# Patient Record
Sex: Male | Born: 1955 | Race: Black or African American | Hispanic: No | Marital: Married | Smoking: Never smoker
Health system: Southern US, Community
[De-identification: ages and names within clinical notes are randomized; demographics above are authoritative.]

## PROBLEM LIST (undated history)

## (undated) DIAGNOSIS — I10 Essential (primary) hypertension: Secondary | ICD-10-CM

## (undated) DIAGNOSIS — N179 Acute kidney failure, unspecified: Secondary | ICD-10-CM

## (undated) DIAGNOSIS — K219 Gastro-esophageal reflux disease without esophagitis: Secondary | ICD-10-CM

## (undated) DIAGNOSIS — Z5189 Encounter for other specified aftercare: Secondary | ICD-10-CM

## (undated) DIAGNOSIS — N183 Chronic kidney disease, stage 3 unspecified: Secondary | ICD-10-CM

## (undated) DIAGNOSIS — K0530 Chronic periodontitis, unspecified: Secondary | ICD-10-CM

## (undated) DIAGNOSIS — K7460 Unspecified cirrhosis of liver: Secondary | ICD-10-CM

## (undated) DIAGNOSIS — K7290 Hepatic failure, unspecified without coma: Secondary | ICD-10-CM

## (undated) DIAGNOSIS — Z944 Liver transplant status: Secondary | ICD-10-CM

## (undated) HISTORY — DX: Other disorders of phosphorus metabolism: E83.39

## (undated) HISTORY — DX: Acute kidney failure, unspecified: N17.9

## (undated) HISTORY — DX: Unspecified cirrhosis of liver: K74.60

## (undated) HISTORY — PX: ABDOMINAL SURGERY: SHX537

## (undated) HISTORY — DX: Liver transplant status: Z94.4

## (undated) HISTORY — DX: Chronic periodontitis, unspecified: K05.30

## (undated) HISTORY — DX: Chronic kidney disease, stage 3 (moderate): N18.3

## (undated) HISTORY — DX: Gastro-esophageal reflux disease without esophagitis: K21.9

## (undated) HISTORY — DX: Hypomagnesemia: E83.42

## (undated) HISTORY — DX: Chronic kidney disease, stage 3 unspecified: N18.30

## (undated) HISTORY — DX: Encounter for other specified aftercare: Z51.89

## (undated) HISTORY — DX: Hepatic failure, unspecified without coma: K72.90

## (undated) HISTORY — PX: COLONOSCOPY: SHX174

---

## 2008-03-13 ENCOUNTER — Encounter: Admission: RE | Admit: 2008-03-13 | Discharge: 2008-03-13 | Payer: Self-pay | Admitting: Chiropractic Medicine

## 2013-04-04 ENCOUNTER — Other Ambulatory Visit: Payer: Self-pay | Admitting: Family Medicine

## 2013-04-04 ENCOUNTER — Ambulatory Visit
Admission: RE | Admit: 2013-04-04 | Discharge: 2013-04-04 | Disposition: A | Discharge status: Home / Self Care | Payer: 59 | Source: Ambulatory Visit | Attending: Family Medicine | Admitting: Family Medicine

## 2013-04-04 DIAGNOSIS — M7989 Other specified soft tissue disorders: Secondary | ICD-10-CM

## 2014-10-28 ENCOUNTER — Encounter (HOSPITAL_COMMUNITY): Payer: Self-pay | Admitting: Emergency Medicine

## 2014-10-28 ENCOUNTER — Inpatient Hospital Stay (HOSPITAL_COMMUNITY)
Admission: EM | Admit: 2014-10-28 | Discharge: 2014-11-03 | DRG: 441 | Disposition: A | Discharge status: Other Acute Inpatient Hospital | Payer: 59 | Attending: Internal Medicine | Admitting: Internal Medicine

## 2014-10-28 ENCOUNTER — Emergency Department (HOSPITAL_COMMUNITY): Payer: 59

## 2014-10-28 DIAGNOSIS — R197 Diarrhea, unspecified: Secondary | ICD-10-CM | POA: Diagnosis present

## 2014-10-28 DIAGNOSIS — D696 Thrombocytopenia, unspecified: Secondary | ICD-10-CM | POA: Diagnosis present

## 2014-10-28 DIAGNOSIS — Z6826 Body mass index (BMI) 26.0-26.9, adult: Secondary | ICD-10-CM

## 2014-10-28 DIAGNOSIS — K7200 Acute and subacute hepatic failure without coma: Secondary | ICD-10-CM | POA: Diagnosis present

## 2014-10-28 DIAGNOSIS — D689 Coagulation defect, unspecified: Secondary | ICD-10-CM | POA: Diagnosis present

## 2014-10-28 DIAGNOSIS — K7460 Unspecified cirrhosis of liver: Secondary | ICD-10-CM | POA: Diagnosis present

## 2014-10-28 DIAGNOSIS — R7989 Other specified abnormal findings of blood chemistry: Secondary | ICD-10-CM

## 2014-10-28 DIAGNOSIS — E876 Hypokalemia: Secondary | ICD-10-CM | POA: Diagnosis present

## 2014-10-28 DIAGNOSIS — K7290 Hepatic failure, unspecified without coma: Secondary | ICD-10-CM | POA: Diagnosis present

## 2014-10-28 DIAGNOSIS — E43 Unspecified severe protein-calorie malnutrition: Secondary | ICD-10-CM | POA: Diagnosis present

## 2014-10-28 DIAGNOSIS — Z79899 Other long term (current) drug therapy: Secondary | ICD-10-CM | POA: Diagnosis not present

## 2014-10-28 DIAGNOSIS — I10 Essential (primary) hypertension: Secondary | ICD-10-CM | POA: Diagnosis present

## 2014-10-28 DIAGNOSIS — R945 Abnormal results of liver function studies: Secondary | ICD-10-CM

## 2014-10-28 HISTORY — DX: Essential (primary) hypertension: I10

## 2014-10-28 LAB — PROTIME-INR
INR: 2.63 — ABNORMAL HIGH (ref 0.00–1.49)
PROTHROMBIN TIME: 27.7 s — AB (ref 11.6–15.2)

## 2014-10-28 LAB — COMPREHENSIVE METABOLIC PANEL
ALT: 1339 U/L — ABNORMAL HIGH (ref 17–63)
ANION GAP: 9 (ref 5–15)
AST: 1519 U/L — ABNORMAL HIGH (ref 15–41)
Albumin: 2.7 g/dL — ABNORMAL LOW (ref 3.5–5.0)
Alkaline Phosphatase: 154 U/L — ABNORMAL HIGH (ref 38–126)
BILIRUBIN TOTAL: 11 mg/dL — AB (ref 0.3–1.2)
BUN: 7 mg/dL (ref 6–20)
CO2: 28 mmol/L (ref 22–32)
Calcium: 8.8 mg/dL — ABNORMAL LOW (ref 8.9–10.3)
Chloride: 105 mmol/L (ref 101–111)
Creatinine, Ser: 0.49 mg/dL — ABNORMAL LOW (ref 0.61–1.24)
Glucose, Bld: 118 mg/dL — ABNORMAL HIGH (ref 65–99)
POTASSIUM: 2.6 mmol/L — AB (ref 3.5–5.1)
Sodium: 142 mmol/L (ref 135–145)
TOTAL PROTEIN: 6.2 g/dL — AB (ref 6.5–8.1)

## 2014-10-28 LAB — URINALYSIS, ROUTINE W REFLEX MICROSCOPIC
Glucose, UA: NEGATIVE mg/dL
Hgb urine dipstick: NEGATIVE
Ketones, ur: NEGATIVE mg/dL
Leukocytes, UA: NEGATIVE
NITRITE: NEGATIVE
PROTEIN: NEGATIVE mg/dL
SPECIFIC GRAVITY, URINE: 1.014 (ref 1.005–1.030)
UROBILINOGEN UA: 1 mg/dL (ref 0.0–1.0)
pH: 6.5 (ref 5.0–8.0)

## 2014-10-28 LAB — CBC
HEMATOCRIT: 39.5 % (ref 39.0–52.0)
HEMOGLOBIN: 13.5 g/dL (ref 13.0–17.0)
MCH: 30.9 pg (ref 26.0–34.0)
MCHC: 34.2 g/dL (ref 30.0–36.0)
MCV: 90.4 fL (ref 78.0–100.0)
Platelets: 83 10*3/uL — ABNORMAL LOW (ref 150–400)
RBC: 4.37 MIL/uL (ref 4.22–5.81)
RDW: 15.5 % (ref 11.5–15.5)
WBC: 6.2 10*3/uL (ref 4.0–10.5)

## 2014-10-28 LAB — LIPASE, BLOOD: Lipase: 47 U/L (ref 22–51)

## 2014-10-28 LAB — MAGNESIUM: MAGNESIUM: 1.4 mg/dL — AB (ref 1.7–2.4)

## 2014-10-28 LAB — BILIRUBIN, DIRECT: Bilirubin, Direct: 4.5 mg/dL — ABNORMAL HIGH (ref 0.1–0.5)

## 2014-10-28 LAB — ACETAMINOPHEN LEVEL

## 2014-10-28 MED ORDER — POTASSIUM CHLORIDE CRYS ER 20 MEQ PO TBCR
40.0000 meq | EXTENDED_RELEASE_TABLET | Freq: Once | ORAL | Status: AC
  Administered 2014-10-28: 40 meq via ORAL
  Filled 2014-10-28: qty 2

## 2014-10-28 NOTE — ED Notes (Signed)
Pt states that he has had weakness and diarrhea for about a month. Pt states that he saw his PCP and was told has virus. Pt unsure if virus is gone or not. Pt c/o abd pain, vomiting that started today.

## 2014-10-28 NOTE — ED Notes (Signed)
Patient denies abdominal pain.  Patient c/o nausea and vomiting as well as diarrhea for the past month.  States this only subsides when patient eats rice, bananas, mashed potatoes (bland foods).  States that when he eats anything else it makes him sick.

## 2014-10-28 NOTE — ED Provider Notes (Signed)
CSN: [id number]     Arrival date & time 10/28/14  1727 History   First MD Initiated Contact with Patient 10/28/14 1911     Chief Complaint  Patient presents with  . Fatigue  . Abdominal Pain  . Vomiting  . Diarrhea     (Consider location/radiation/quality/duration/timing/severity/associated sxs/prior Treatment) HPI Comments: Patient presents to the ED with a chief complaint of abdominal pain, weakness, vomiting, and diarrhea.  Patient seen by his PCP and reportedly diagnosed with gastroenteritis.  Patient states that his symptoms have been persistent for the past month.  He states that the abdominal pain and vomiting are worsened after eating.  He states that sometimes it feel like he has GERD.  He has not tried taking for his symptoms.  He denies any other medical problems.  Denies any alcohol use.  The history is provided by the patient. No language interpreter was used.    Past Medical History  Diagnosis Date  . Hypertension    Past Surgical History  Procedure Laterality Date  . Abdominal surgery     No family history on file. Social History  Substance Use Topics  . Smoking status: Never Smoker   . Smokeless tobacco: None  . Alcohol Use: No    Review of Systems  Constitutional: Positive for fatigue. Negative for fever and chills.  Respiratory: Negative for shortness of breath.   Cardiovascular: Negative for chest pain.  Gastrointestinal: Positive for nausea, vomiting, abdominal pain and diarrhea. Negative for constipation.  Genitourinary: Negative for dysuria.  All other systems reviewed and are negative.     Allergies  Review of patient's allergies indicates no known allergies.  Home Medications   Prior to Admission medications   Medication Sig Start Date End Date Taking? Authorizing Provider  AZOR 10-40 MG tablet Take 1 tablet by mouth daily.  10/09/14  Yes Historical Provider, MD  doxazosin (CARDURA) 8 MG tablet Take 8 mg by mouth daily.  10/09/14  Yes  Historical Provider, MD   BP 143/91 mmHg  Pulse 94  Temp(Src) 97.7 F (36.5 C) (Oral)  Resp 18  SpO2 100% Physical Exam  Constitutional: He is oriented to person, place, and time. He appears well-developed and well-nourished.  HENT:  Head: Normocephalic and atraumatic.  Eyes: Conjunctivae and EOM are normal. Pupils are equal, round, and reactive to light. Right eye exhibits no discharge. Left eye exhibits no discharge. No scleral icterus.  Neck: Normal range of motion. Neck supple. No JVD present.  Cardiovascular: Normal rate, regular rhythm and normal heart sounds.  Exam reveals no gallop and no friction rub.   No murmur heard. Pulmonary/Chest: Effort normal and breath sounds normal. No respiratory distress. He has no wheezes. He has no rales. He exhibits no tenderness.  Abdominal: Soft. He exhibits no distension and no mass. There is no tenderness. There is no rebound and no guarding.  No focal abdominal tenderness, no RLQ tenderness or pain at McBurney's point, no RUQ tenderness or Murphy's sign, no left-sided abdominal tenderness, no fluid wave, or signs of peritonitis   Musculoskeletal: Normal range of motion. He exhibits no edema or tenderness.  Neurological: He is alert and oriented to person, place, and time.  Skin: Skin is warm and dry.  Psychiatric: He has a normal mood and affect. His behavior is normal. Judgment and thought content normal.  Nursing note and vitals reviewed.   ED Course  Procedures (including critical care time) Results for orders placed or performed during the hospital encounter of 10/28/14  Lipase, blood  Result Value Ref Range   Lipase 47 22 - 51 U/L  Comprehensive metabolic panel  Result Value Ref Range   Sodium 142 135 - 145 mmol/L   Potassium 2.6 (LL) 3.5 - 5.1 mmol/L   Chloride 105 101 - 111 mmol/L   CO2 28 22 - 32 mmol/L   Glucose, Bld 118 (H) 65 - 99 mg/dL   BUN 7 6 - 20 mg/dL   Creatinine, Ser 0.49 (L) 0.61 - 1.24 mg/dL   Calcium 8.8 (L)  8.9 - 10.3 mg/dL   Total Protein 6.2 (L) 6.5 - 8.1 g/dL   Albumin 2.7 (L) 3.5 - 5.0 g/dL   AST 1519 (H) 15 - 41 U/L   ALT 1339 (H) 17 - 63 U/L   Alkaline Phosphatase 154 (H) 38 - 126 U/L   Total Bilirubin 11.0 (H) 0.3 - 1.2 mg/dL   GFR calc non Af Amer >60 >60 mL/min   GFR calc Af Amer >60 >60 mL/min   Anion gap 9 5 - 15  CBC  Result Value Ref Range   WBC 6.2 4.0 - 10.5 K/uL   RBC 4.37 4.22 - 5.81 MIL/uL   Hemoglobin 13.5 13.0 - 17.0 g/dL   HCT 39.5 39.0 - 52.0 %   MCV 90.4 78.0 - 100.0 fL   MCH 30.9 26.0 - 34.0 pg   MCHC 34.2 30.0 - 36.0 g/dL   RDW 15.5 11.5 - 15.5 %   Platelets 83 (L) 150 - 400 K/uL  Urinalysis, Routine w reflex microscopic (not at [facility])  Result Value Ref Range   Color, Urine ORANGE (A) YELLOW   APPearance CLEAR CLEAR   Specific Gravity, Urine 1.014 1.005 - 1.030   pH 6.5 5.0 - 8.0   Glucose, UA NEGATIVE NEGATIVE mg/dL   Hgb urine dipstick NEGATIVE NEGATIVE   Bilirubin Urine MODERATE (A) NEGATIVE   Ketones, ur NEGATIVE NEGATIVE mg/dL   Protein, ur NEGATIVE NEGATIVE mg/dL   Urobilinogen, UA 1.0 0.0 - 1.0 mg/dL   Nitrite NEGATIVE NEGATIVE   Leukocytes, UA NEGATIVE NEGATIVE   US Abdomen Complete  10/28/2014   CLINICAL DATA:  Abdominal pain, nausea vomiting started 1 day ago. Increased liver function tests.  EXAM: ULTRASOUND ABDOMEN COMPLETE  COMPARISON:  None.  FINDINGS: Gallbladder: The gallbladder is normally distended and contains a large amount of sludge. Gallbladder wall measures 3 mm.  Common bile duct: Diameter: 2 mm.  Liver: The liver demonstrates diffusely heterogeneous echogenicity. Normal hepatopetal portal venous flow. Sonographic Percell Miller sign is negative.  IVC: No abnormality visualized.  Pancreas: Is not seen.  Spleen: Size and appearance within normal limits.  Right Kidney: Length: 11.7 cm. Echogenicity within normal limits. No mass or hydronephrosis visualized.  Left Kidney: Length: 8.8 cm. 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 Echogenicity within normal  limits. No mass or hydronephrosis visualized.  Abdominal aorta: No evidence of aneurysmal dilation. Maximum diameter of the visualized proximal aorta is 2.6 cm.  Other findings: Minimal perihepatic abdominal ascites.  IMPRESSION: Diffusely heterogeneous echogenicity of the liver which may be seen with hepatic cirrhosis or fibrosis. Minimal perihepatic ascites.  Gallbladder sludge without evidence of acute cholecystitis.   Electronically Signed   By: Fidela Salisbury M.D.   On: 10/28/2014 20:46      MDM   Final diagnoses:  Elevated LFTs    Reports abdominal pain, nvd, but no abdominal pain on exam.  LFTs are through the roof.  Will check hepatitis panel and US abdomen.  Denies any  etoh use.  VSS. Denies excessive use of acetaminophen. K is low, will supplement orally.  Suspect a lot of this is from chronic diarrhea.  Patient discussed with Dr. Billy Fischer, who recommends GI consult.    Appreciate phone consultation with Dr. Collene Mares from GI, who recommends admission for elevated transaminases. Patient will need HIDA and hepatitis panel.  GI will consult in the morning.  I discussed this plan with the patient and his family, all are in agreement.  Appreciate Dr. Hal Hope for admitting the patient.  Mg, acetaminophen, direct bilirubin, and PT INR pending.  Montine Circle, PA-C 10/28/14 2350  Gareth Morgan, MD 10/31/14 0025

## 2014-10-29 ENCOUNTER — Inpatient Hospital Stay (HOSPITAL_COMMUNITY): Payer: 59

## 2014-10-29 ENCOUNTER — Encounter (HOSPITAL_COMMUNITY): Payer: Self-pay

## 2014-10-29 DIAGNOSIS — I10 Essential (primary) hypertension: Secondary | ICD-10-CM

## 2014-10-29 DIAGNOSIS — D696 Thrombocytopenia, unspecified: Secondary | ICD-10-CM | POA: Diagnosis present

## 2014-10-29 DIAGNOSIS — R197 Diarrhea, unspecified: Secondary | ICD-10-CM | POA: Diagnosis present

## 2014-10-29 DIAGNOSIS — E876 Hypokalemia: Secondary | ICD-10-CM | POA: Diagnosis present

## 2014-10-29 LAB — HEPATIC FUNCTION PANEL
ALBUMIN: 2.5 g/dL — AB (ref 3.5–5.0)
ALK PHOS: 147 U/L — AB (ref 38–126)
ALT: 1236 U/L — AB (ref 17–63)
AST: 1393 U/L — ABNORMAL HIGH (ref 15–41)
BILIRUBIN INDIRECT: 5.8 mg/dL — AB (ref 0.3–0.9)
BILIRUBIN TOTAL: 10.7 mg/dL — AB (ref 0.3–1.2)
Bilirubin, Direct: 4.9 mg/dL — ABNORMAL HIGH (ref 0.1–0.5)
TOTAL PROTEIN: 5.9 g/dL — AB (ref 6.5–8.1)

## 2014-10-29 LAB — BASIC METABOLIC PANEL
ANION GAP: 8 (ref 5–15)
BUN: 7 mg/dL (ref 6–20)
CHLORIDE: 108 mmol/L (ref 101–111)
CO2: 28 mmol/L (ref 22–32)
Calcium: 8.6 mg/dL — ABNORMAL LOW (ref 8.9–10.3)
Creatinine, Ser: 0.45 mg/dL — ABNORMAL LOW (ref 0.61–1.24)
GFR calc non Af Amer: >60 mL/min (ref >60)
GLUCOSE: 88 mg/dL (ref 65–99)
Potassium: 3.6 mmol/L (ref 3.5–5.1)
Sodium: 144 mmol/L (ref 135–145)

## 2014-10-29 LAB — PROTIME-INR
INR: 2.56 — ABNORMAL HIGH (ref 0.00–1.49)
Prothrombin Time: 27.1 seconds — ABNORMAL HIGH (ref 11.6–15.2)

## 2014-10-29 LAB — CBC
HCT: 36.3 % — ABNORMAL LOW (ref 39.0–52.0)
HEMOGLOBIN: 12.3 g/dL — AB (ref 13.0–17.0)
MCH: 30.1 pg (ref 26.0–34.0)
MCHC: 33.9 g/dL (ref 30.0–36.0)
MCV: 88.8 fL (ref 78.0–100.0)
Platelets: 86 10*3/uL — ABNORMAL LOW (ref 150–400)
RBC: 4.09 MIL/uL — AB (ref 4.22–5.81)
RDW: 15.7 % — ABNORMAL HIGH (ref 11.5–15.5)
WBC: 5.6 10*3/uL (ref 4.0–10.5)

## 2014-10-29 LAB — HIV ANTIBODY (ROUTINE TESTING W REFLEX): HIV Screen 4th Generation wRfx: NONREACTIVE

## 2014-10-29 LAB — AMMONIA: Ammonia: 93 umol/L — ABNORMAL HIGH (ref 9–35)

## 2014-10-29 MED ORDER — BOOST / RESOURCE BREEZE PO LIQD
1.0000 | Freq: Three times a day (TID) | ORAL | Status: DC
  Administered 2014-10-29 – 2014-11-01 (×8): 1 via ORAL

## 2014-10-29 MED ORDER — DOXAZOSIN MESYLATE 8 MG PO TABS
8.0000 mg | ORAL_TABLET | Freq: Every day | ORAL | Status: DC
  Administered 2014-10-29 – 2014-11-02 (×5): 8 mg via ORAL
  Filled 2014-10-29 (×6): qty 1

## 2014-10-29 MED ORDER — ONDANSETRON HCL 4 MG/2ML IJ SOLN: 4.0000 mg | Freq: Four times a day (QID) | INTRAMUSCULAR | Status: DC | PRN

## 2014-10-29 MED ORDER — POTASSIUM CHLORIDE IN NACL 20-0.9 MEQ/L-% IV SOLN
INTRAVENOUS | Status: DC
  Administered 2014-10-29: 03:00:00 via INTRAVENOUS
  Filled 2014-10-29 (×2): qty 1000

## 2014-10-29 MED ORDER — POTASSIUM CHLORIDE 10 MEQ/100ML IV SOLN
10.0000 meq | INTRAVENOUS | Status: AC
  Administered 2014-10-29 (×3): 10 meq via INTRAVENOUS
  Filled 2014-10-29 (×3): qty 100

## 2014-10-29 MED ORDER — POTASSIUM CHLORIDE CRYS ER 20 MEQ PO TBCR
40.0000 meq | EXTENDED_RELEASE_TABLET | Freq: Once | ORAL | Status: AC
  Administered 2014-10-29: 40 meq via ORAL
  Filled 2014-10-29: qty 2

## 2014-10-29 MED ORDER — MAGNESIUM SULFATE 2 GM/50ML IV SOLN
2.0000 g | Freq: Once | INTRAVENOUS | Status: AC
  Administered 2014-10-29: 2 g via INTRAVENOUS
  Filled 2014-10-29: qty 50

## 2014-10-29 MED ORDER — IRBESARTAN 300 MG PO TABS
300.0000 mg | ORAL_TABLET | Freq: Every day | ORAL | Status: DC
  Filled 2014-10-29: qty 1

## 2014-10-29 MED ORDER — AMLODIPINE BESYLATE 10 MG PO TABS
10.0000 mg | ORAL_TABLET | Freq: Every day | ORAL | Status: DC
  Administered 2014-10-29 – 2014-11-02 (×5): 10 mg via ORAL
  Filled 2014-10-29 (×6): qty 1

## 2014-10-29 MED ORDER — DEXTROSE 5 % IV SOLN
5.0000 mg | Freq: Once | INTRAVENOUS | Status: AC
  Administered 2014-10-29: 5 mg via INTRAVENOUS
  Filled 2014-10-29: qty 0.5

## 2014-10-29 MED ORDER — CETYLPYRIDINIUM CHLORIDE 0.05 % MT LIQD
7.0000 mL | Freq: Two times a day (BID) | OROMUCOSAL | Status: DC
  Administered 2014-10-29 – 2014-11-02 (×10): 7 mL via OROMUCOSAL

## 2014-10-29 MED ORDER — ONDANSETRON HCL 4 MG PO TABS: 4.0000 mg | ORAL_TABLET | Freq: Four times a day (QID) | ORAL | Status: DC | PRN

## 2014-10-29 MED ORDER — AMLODIPINE-OLMESARTAN 10-40 MG PO TABS: 1.0000 | ORAL_TABLET | Freq: Every day | ORAL | Status: DC

## 2014-10-29 MED ORDER — ENSURE ENLIVE PO LIQD: 237.0000 mL | Freq: Two times a day (BID) | ORAL | Status: DC

## 2014-10-29 MED ORDER — IOHEXOL 300 MG/ML  SOLN
100.0000 mL | Freq: Once | INTRAMUSCULAR | Status: AC | PRN
  Administered 2014-10-29: 100 mL via INTRAVENOUS

## 2014-10-29 MED ORDER — IOHEXOL 300 MG/ML  SOLN
25.0000 mL | INTRAMUSCULAR | Status: AC
  Administered 2014-10-29 (×2): 25 mL via ORAL

## 2014-10-29 NOTE — Consult Note (Signed)
Referring Provider:   Dr. Daleen Bo Primary Care Physician:  Dr. Samara Snide Primary Gastroenterologist:  Dr. Clarene Essex  Reason for Consultation:  Elevated liver chemistries  HPI: Bruce Conner is a 59 y.o. male with no prior history of liver disease and no significant history of alcohol abuse, who has a roughly 1 month history of intermittent postprandial diarrhea, 30 pound weight loss, two-week history of dark urine, intermittent fatigue who was admitted through the emergency room yesterday when his wife noticed that his eyes were yellow. Risk factors for hepatitis appeared to be absent. 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 With respect to blood pouring pathogens, no history of IV drug abuse or tattoos. No known toxic exposures such as excessive acetaminophen or herbal supplements.  Radiographic evaluation shows an inhomogenous liver parenchyma consistent with possible fatty infiltration, no nodularity, significant ascites, splenomegaly, or varices to suggest cirrhosis. There is sludge in the gallbladder but no evidence of biliary obstruction.  Labs are pertinent for transaminases in the 1000 range, bilirubin around 10,  minimally improved today compared to yesterday. INR is moderately elevated at 2.5, and platelets are low at 86,000.  The patient has not had abdominal pain and has had reasonable stamina, going to work as recently as 3 days ago.   Past Medical History  Diagnosis Date  . Hypertension     Past Surgical History  Procedure Laterality Date  . Abdominal surgery      Prior to Admission medications   Medication Sig Start Date End Date Taking? Authorizing Provider  AZOR 10-40 MG tablet Take 1 tablet by mouth daily.  10/09/14  Yes Historical Provider, MD  doxazosin (CARDURA) 8 MG  tablet Take 8 mg by mouth daily.  10/09/14  Yes Historical Provider, MD    Current Facility-Administered Medications  Medication Dose Route Frequency Provider Last Rate Last Dose  . 0.9 % NaCl with KCl 20 mEq/ L  infusion   Intravenous Continuous Rise Patience, MD 75 mL/hr at 10/29/14 0252    . amLODipine (NORVASC) tablet 10 mg  10 mg Oral Daily Rise Patience, MD   10 mg at 10/29/14 0940  . antiseptic oral rinse (CPC / CETYLPYRIDINIUM CHLORIDE 0.05%) solution 7 mL  7 mL Mouth Rinse BID Rise Patience, MD   7 mL at 10/29/14 1000  . doxazosin (CARDURA) tablet 8 mg  8 mg Oral Daily Rise Patience, MD   8 mg at 10/29/14 0940  . feeding supplement (BOOST / RESOURCE BREEZE) liquid 1 Container  1 Container Oral TID BM Rosezetta Schlatter, RD   1 Container at 10/29/14 1400  . ondansetron (ZOFRAN) tablet 4 mg  4 mg Oral Q6H PRN Rise Patience, MD       Or  . ondansetron [facility]) injection 4 mg  4 mg Intravenous Q6H PRN Rise Patience, MD        Allergies as of 10/28/2014  . (No Known Allergies)    Family History  Problem Relation Age of Onset  . Hypertension Brother     Social History   Social History  . Marital Status: Unknown    Spouse Name: N/A  . Number of Children: N/A  . Years of Education: N/A   Occupational History  . Not on file.  Social History Main Topics  . Smoking status: Never Smoker   . Smokeless tobacco: Not on file  . Alcohol Use: No  . Drug Use: Not on file  . Sexual Activity: Not on file   Other Topics Concern  . Not on file   Social History Narrative    Review of Systems: No problem with chest pain or breathing difficulties, headaches or loose teeth, skin rashes or joint effusions, urinary difficulties or confusional episodes. No problem with anorexia, despite his weight loss which he attributes to malabsorption (postprandial diarrhea).  Physical Exam: Vital signs in last 24 hours: Temp:  [98.2 F (36.8 C)-98.5 F (36.9  C)] 98.2 F (36.8 C) (10/10 1500) Pulse Rate:  [65-88] 80 (10/10 1500) Resp:  [18-20] 18 (10/10 1500) BP: (132-158)/(68-76) 145/72 mmHg (10/10 1500) SpO2:  [97 %-99 %] 98 % (10/10 1500) Weight:  [71.668 kg (158 lb)] 71.668 kg (158 lb) (10/10 0012) Last BM Date: 10/28/14 General:   Alert,  Well-developed, well-nourished, pleasant and cooperative in NAD Head:  Normocephalic and atraumatic. Eyes:  Sclerae icteric.   Conjunctiva pink. Mouth:   No ulcerations or lesions.  Oropharynx pink & moist. Poor dentition. Neck:   No masses or thyromegaly. Lungs:  Clear throughout to auscultation.   No wheezes, crackles, or rhonchi. No evident respiratory distress. Heart:   Regular rate and rhythm; no murmurs, clicks, rubs,  or gallops. Abdomen:  Soft, nontender, nontympanitic, and nondistended. No masses, hepatosplenomegaly or ventral hernias noted. No obvious ascites. Normal bowel sounds, without bruits, guarding, or rebound.   Msk:   Symmetrical without gross deformities. Extremities:   Without clubbing, cyanosis, or edema. Neurologic:  Alert and coherent;  grossly normal neurologically. He does serial threes slowly and with one error. There is perhaps 1 beat of asterixis. Skin:  Intact without significant lesions or rashes. Cervical Nodes:  No significant cervical adenopathy. Psych:   Alert and cooperative. Normal mood and affect.  Intake/Output from previous day: 10/09 0701 - 10/10 0700 In: 610 [I.V.:310; IV Piggyback:300] Out: -  Intake/Output this shift:    Lab Results:  Recent Labs  10/28/14 1811 10/29/14 0443  WBC 6.2 5.6  HGB 13.5 12.3*  HCT 39.5 36.3*  PLT 83* 86*   BMET  Recent Labs  10/28/14 1811 10/29/14 0443  NA 142 144  K 2.6* 3.6  CL 105 108  CO2 28 28  GLUCOSE 118* 88  BUN 7 7  CREATININE 0.49* 0.45*  CALCIUM 8.8* 8.6*   LFT  Recent Labs  10/29/14 0443  PROT 5.9*  ALBUMIN 2.5*  AST 1393*  ALT 1236*  ALKPHOS 147*  BILITOT 10.7*  BILIDIR 4.9*   IBILI 5.8*   PT/INR  Recent Labs  10/28/14 2256 10/29/14 0443  LABPROT 27.7* 27.1*  INR 2.63* 2.56*    Studies/Results: US Abdomen Complete  10/28/2014   CLINICAL DATA:  Abdominal pain, nausea vomiting started 1 day ago. Increased liver function tests.  EXAM: ULTRASOUND ABDOMEN COMPLETE  COMPARISON:  None.  FINDINGS: Gallbladder: The gallbladder is normally distended and contains a large amount of sludge. Gallbladder wall measures 3 mm.  Common bile duct: Diameter: 2 mm.  Liver: The liver demonstrates diffusely heterogeneous echogenicity. Normal hepatopetal portal venous flow. Sonographic Percell Miller sign is negative.  IVC: No abnormality visualized.  Pancreas: Is not seen.  Spleen: Size and appearance within normal limits.  Right Kidney: Length: 11.7 cm. Echogenicity within normal limits. No mass or hydronephrosis visualized.  Left Kidney: Length: 8.8 cm. 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 Echogenicity within normal limits. No mass or hydronephrosis visualized.  Abdominal aorta: No evidence of aneurysmal dilation. Maximum diameter of the visualized proximal aorta is 2.6 cm.  Other findings: Minimal perihepatic abdominal ascites.  IMPRESSION: Diffusely heterogeneous echogenicity of the liver which may be seen with hepatic cirrhosis or fibrosis. Minimal perihepatic ascites.  Gallbladder sludge without evidence of acute cholecystitis.   Electronically Signed   By: Fidela Salisbury M.D.   On: 10/28/2014 20:46   Ct Abdomen Pelvis W Contrast  10/29/2014   CLINICAL DATA:  Persistent diarrhea for the last month. Weight loss. Elevated LFTs.  EXAM: CT ABDOMEN AND PELVIS WITH CONTRAST  TECHNIQUE: Multidetector CT imaging of the abdomen and pelvis was performed using the standard protocol following bolus administration of intravenous contrast.  CONTRAST:  181mL OMNIPAQUE IOHEXOL 300 MG/ML  SOLN  COMPARISON:  Ultrasound 10/28/2014  FINDINGS: Minimal bibasilar atelectasis, left greater than right. No effusions.  Heart is normal size.  Heterogeneous low density throughout the liver, likely areas a geographic fatty infiltration. No definite focal abnormality. No biliary ductal dilatation. Gallbladder, spleen, pancreas, adrenals and right kidney are unremarkable. Left kidney is a in the low left pelvis posterior to the left bladder wall. No hydronephrosis or focal renal abnormality.  Stomach, large and small bowel are unremarkable. There is a small amount of free fluid around the liver. No free air or adenopathy. Urinary bladder is unremarkable. Aorta is mildly tortuous, non aneurysmal.  No acute bony abnormality or focal bone lesion.  IMPRESSION: Heterogeneous areas of low density throughout the liver, likely geographic fatty infiltration. Small amount of free fluid around the liver.  Bibasilar atelectasis, left greater than right.  Left pelvic kidney.   Electronically Signed   By: Rolm Baptise M.D.   On: 10/29/2014 10:52    Impression: 1. Acute hepatitis without current evidence of hepatic failure (mentation okay, no progressive rise in bilirubin or INR). I suspect that this is some sort of infectious process, but it remains unclear whether we will be able to confirm a culprit serologically, such as hepatitis A or hepatitis B. 2. One month history of postprandial diarrhea and significant weight loss. The relationship of this to the patient's hepatitis is unclear, as is its etiology. 3. I do not see evidence for significant chronic underlying liver disease, such as hepatic nodularity on imaging studies, splenomegaly to suggest portal hypertension, varices, significant ascites, etc. I think that his thrombocytopenia may simply be due to bone marrow suppression from the presumed infectious illness that is causing his hepatitis.  Plan: 1. Await hepatitis serologies 2. Administer vitamin K to see if it will help correct his elevated INR 3. No further diagnostic testing on the liver anticipated at present 4. Monitor  for evidence of fulminant hepatic failure, which would manifest primarily as altered mental status or possibly a dramatic rise in his bilirubin. 5. Despite his elevated ammonia, I do not feel he needs lactulose as long as his mental status remains good, as it currently is. 6. Check stool for qualitative fecal fat to see if he is showing signs of malabsorption. If so, further testing to include celiac antibodies and perhaps a therapeutic trial of pancreatic enzymes would be in order.  Cleotis Nipper, M.D. Pager [phone] If no answer or after 5 PM call [phone]     LOS: 1 day   [place] [facility]  10/29/2014, 8:10 PM   Pager [phone] If no answer or after 5 PM call [phone]

## 2014-10-29 NOTE — Progress Notes (Signed)
Initial Nutrition Assessment  DOCUMENTATION CODES:   Severe malnutrition in context of acute illness/injury  INTERVENTION:  - Will d/c Ensure Enlive and order Boost Breeze TID, each supplement provides 250 kcal and 9 grams of protein - RD will continue to monitor for needs - Continue to encourage Bland diet  NUTRITION DIAGNOSIS:   Inadequate oral intake related to acute illness, other (see comment) (diarrhea) as evidenced by per patient/family report.  GOAL:   Patient will meet greater than or equal to 90% of their needs  MONITOR:   PO intake, Supplement acceptance, Weight trends, Labs, I & O's  REASON FOR ASSESSMENT:   Malnutrition Screening Tool  ASSESSMENT:   59 y.o. male with history of hypertension presents to the ER because of persistent diarrhea over the last 1 month. Patient states his diarrhea is more on eating solid food. Happens 2-3 times a day. Denies any recent use of antibiotics or sick contacts. Denies any recent travel. Denies any fever chills or abdominal pain. Denies any nausea vomiting. Patient states he may have lost at least 30 pounds. In the ER patient was found to have markedly elevated LFTs. Sonogram of abdomen shows gallbladder sludge with possible cirrhosis. On-call gastroenterologist Dr. Collene Mares was consulted and patient has been admitted for further management. Patient denies taking any NSAIDs or Tylenol. Patient states his diarrhea is watery denies any blood in the diarrhea. Lab work shows a markedly elevated LFTs with elevated INR and thrombocytopenia. Patient's last alcohol drink was in 1998 until patient used to drink alcohol once or twice a month.  Pt seen for MST. BMI indicates overweight status. Pt ate ~50% of breakfast which consisted of rice krispies, applesauce, and plain toast. Pt denies abdominal pain or nausea at this time and states that he was not experiencing this PTA. He states he had one episode of emesis yesterday (10/9) but no previous or  subsequent episodes. He states that for the past 1 month he has been having diarrhea. He states that this was exacerbated by heavily spiced or greasy/fatty foods. He has been able to tolerate bland foods (he gives the examples of plain baked potato or mashed potatoes, plain white rice, applesauce, and plain toast) without issue/exacerbation as well as fluids (which he states he was only drinking Gatorade, Sprite, and iced tea PTA).  Pt states that his weight had previously been stable but that he has lost 26 lbs in the past month. He states UBW of 186 lbs. Per chart review, pt has lost 28 lbs (15% body weight) in the past 1 month. Mild muscle and fat wasting noted.  Pt not meeting needs. Will d/c Ensure Enlive and order Boost Breeze as pt may do better with clear liquid supplement at this time. Will also order Prostat at follow-up if pt tolerating Boost Breeze well. Medications reviewed. Labs reviewed; creatinine low, Ca: 8.6 mg/dL, Mg: 1.4 mg/dL, LFTs elevated.    Diet Order:  Diet Heart Room service appropriate?: Yes; Fluid consistency:: Thin  Skin:  Reviewed, no issues  Last BM:  10/9  Height:   Ht Readings from Last 1 Encounters:  10/29/14 5\' 5"  (1.651 m)    Weight:   Wt Readings from Last 1 Encounters:  10/29/14 158 lb (71.668 kg)    Ideal Body Weight:  61.82 kg (kg)  BMI:  Body mass index is 26.29 kg/(m^2).  Estimated Nutritional Needs:   Kcal:  1790-1990  Protein:  70-80 grams  Fluid:  >/= 2.5 L/day  EDUCATION NEEDS:  No education needs identified at this time      Jarome Matin, [place], [facility] Inpatient Clinical Dietitian Pager # [phone] After hours/weekend pager # [phone]

## 2014-10-29 NOTE — Progress Notes (Signed)
[facility] PROGRESS NOTE  ROVERTO BODMER BZJ:[id number] DOB: 04-02-1955 DOA: 10/28/2014 PCP: No primary care provider on file.  Assessment/Plan: 59 y/o male with PMH of HTN presented with diarrhea for 1 month and found to have abnormal LFTs and cirrhosis   1. Liver cirrhosis of unclear etiology. Coagulopathy, thrombocytopenia. Abnormal LFTs of unclear etiology, but likely due to cirrhosis.  -check hepatitis panel, mitochondrial anbd, ANA, pend CT abd/pelvis. consulted GI eval. Hold olmesartan due to GUI side effect    2. Diarrhea in the setting of liver cirrhosis. Pend GI panel   3. HTN. Stable. Cont home regimen   Code Status: full Family Communication: d/w patient (indicate person spoken with, relationship, and if by phone, the number) Disposition Plan: home pend clinical improvement    Consultants:  GI   Procedures:  none  Antibiotics:  none (indicate start date, and stop date if known)  HPI/Subjective: Alert, oriented   Objective: Filed Vitals:   10/29/14 0432  BP: 132/76  Pulse: 88  Temp: 98.5 F (36.9 C)  Resp: 20    Intake/Output Summary (Last 24 hours) at 10/29/14 0908 Last data filed at 10/29/14 0700  Gross per 24 hour  Intake    610 ml  Output      0 ml  Net    610 ml   Filed Weights   10/29/14 0012  Weight: 71.668 kg (158 lb)    Exam:   General:  No distress   Cardiovascular: s1,s2 rrr  Respiratory: CTA BL   Abdomen: soft, nt,nd   Musculoskeletal: no leg edema   Data Reviewed: Basic Metabolic Panel:  Recent Labs Lab 10/28/14 1811 10/28/14 2256 10/29/14 0443  NA 142  --  144  K 2.6*  --  3.6  CL 105  --  108  CO2 28  --  28  GLUCOSE 118*  --  88  BUN 7  --  7  CREATININE 0.49*  --  0.45*  CALCIUM 8.8*  --  8.6*  MG  --  1.4*  --    Liver Function Tests:  Recent Labs Lab 10/28/14 1811 10/29/14 0443  AST 1519* 1393*  ALT 1339* 1236*  ALKPHOS 154* 147*  BILITOT 11.0* 10.7*  PROT 6.2* 5.9*  ALBUMIN 2.7*  2.5*    Recent Labs Lab 10/28/14 1811  LIPASE 47    Recent Labs Lab 10/29/14 0732  AMMONIA 93*   CBC:  Recent Labs Lab 10/28/14 1811 10/29/14 0443  WBC 6.2 5.6  HGB 13.5 12.3*  HCT 39.5 36.3*  MCV 90.4 88.8  PLT 83* 86*   Cardiac Enzymes: No results for input(s): CKTOTAL, CKMB, CKMBINDEX, TROPONINI in the last 168 hours. BNP (last 3 results) No results for input(s): BNP in the last 8760 hours.  ProBNP (last 3 results) No results for input(s): PROBNP in the last 8760 hours.  CBG: No results for input(s): GLUCAP in the last 168 hours.  No results found for this or any previous visit (from the past 240 hour(s)).   Studies: US Abdomen Complete  10/28/2014   CLINICAL DATA:  Abdominal pain, nausea vomiting started 1 day ago. Increased liver function tests.  EXAM: ULTRASOUND ABDOMEN COMPLETE  COMPARISON:  None.  FINDINGS: Gallbladder: The gallbladder is normally distended and contains a large amount of sludge. Gallbladder wall measures 3 mm.  Common bile duct: Diameter: 2 mm.  Liver: The liver demonstrates diffusely heterogeneous echogenicity. Normal hepatopetal portal venous flow. Sonographic Percell Miller sign is negative.  IVC: No abnormality  visualized.  Pancreas: Is not seen.  Spleen: Size and appearance within normal limits.  Right Kidney: Length: 11.7 cm. Echogenicity within normal limits. No mass or hydronephrosis visualized.  Left Kidney: Length: 8.8 cm. 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 Echogenicity within normal limits. No mass or hydronephrosis visualized.  Abdominal aorta: No evidence of aneurysmal dilation. Maximum diameter of the visualized proximal aorta is 2.6 cm.  Other findings: Minimal perihepatic abdominal ascites.  IMPRESSION: Diffusely heterogeneous echogenicity of the liver which may be seen with hepatic cirrhosis or fibrosis. Minimal perihepatic ascites.  Gallbladder sludge without evidence of acute cholecystitis.   Electronically Signed   By: Fidela Salisbury M.D.    On: 10/28/2014 20:46    Scheduled Meds: . amLODipine  10 mg Oral Daily   And  . irbesartan  300 mg Oral Daily  . antiseptic oral rinse  7 mL Mouth Rinse BID  . doxazosin  8 mg Oral Daily  . feeding supplement (ENSURE ENLIVE)  237 mL Oral BID BM  . iohexol  25 mL Oral Q1 Hr x 2   Continuous Infusions: . 0.9 % NaCl with KCl 20 mEq / L 75 mL/hr at 10/29/14 5374    Active Problems:   Elevated LFTs   Hypokalemia   Hypomagnesemia   Essential hypertension   Diarrhea   Thrombocytopenia (HCC)    Time spent: >365 minutes     Kinnie Feil  [facility] Pager [phone]. If 7PM-7AM, please contact night-coverage at [email or web address], password [facility] 10/29/2014, 9:08 AM  LOS: 1 day

## 2014-10-29 NOTE — H&P (Signed)
[facility] History and Physical  Bruce Conner OFB:[id number] DOB: [birth year] DOA: 10/28/2014  Referring physician: Mr. Montine Conner. PCP: No primary care provider on file. Bruce Conner. Specialists: None.  Chief Complaint: Diarrhea.  HPI: Bruce Conner is a 59 y.o. male with history of hypertension presents to the ER because of persistent diarrhea over the last 1 month. Patient states his diarrhea is more on eating solid food. Happens 2-3 times a day. Denies any recent use of antibiotics or sick contacts. Denies any recent travel. Denies any fever chills or abdominal pain. Denies any nausea vomiting. Patient states he may have lost at least 30 pounds. In the ER patient was found to have markedly elevated LFTs. Sonogram of abdomen shows gallbladder sludge with possible cirrhosis. On-call gastroenterologist Bruce Conner was consulted and patient has been admitted for further management. Patient denies taking any NSAIDs or Tylenol. Patient states his diarrhea is watery denies any blood in the diarrhea. Lab work shows a markedly elevated LFTs with elevated INR and thrombocytopenia. Patient's last alcohol drink was in 1998 until patient used to drink alcohol once or twice a month.  Review of Systems: As presented in the history of presenting illness, rest negative.  Past Medical History  Diagnosis Date  . Hypertension    Past Surgical History  Procedure Laterality Date  . Abdominal surgery     Social History:  reports that he has never smoked. He does not have any smokeless tobacco history on file. He reports that he does not drink alcohol. His drug history is not on file. Where does patient live home. Can patient participate in ADLs? Yes.  No Known Allergies  Family History:  Family History  Problem Relation Age of Onset  . Hypertension Brother       Prior to Admission medications   Medication Sig Start Date End Date Taking? Authorizing Provider  AZOR 10-40 MG  tablet Take 1 tablet by mouth daily.  10/09/14  Yes Historical Provider, MD  doxazosin (CARDURA) 8 MG tablet Take 8 mg by mouth daily.  10/09/14  Yes Historical Provider, MD    Physical Exam: Filed Vitals:   10/28/14 1741 10/28/14 2000 10/28/14 2030 10/29/14 0012  BP: 143/91 137/87 152/68 158/75  Pulse: 94 73 69 65  Temp: 97.7 F (36.5 C)   98.3 F (36.8 C)  TempSrc: Oral   Oral  Resp: 18 19 18 20   Height:    5\' 5"  (1.651 m)  Weight:    71.668 kg (158 lb)  SpO2: 100% 98% 97% 99%     General:  Moderately built and nourished.  Eyes: Icterus present. No pallor.  ENT: No discharge from the ears eyes nose and mouth.  Neck: No mass felt. No neck rigidity.  Cardiovascular: S1-S2 heard.  Respiratory: No rhonchi or crepitations.  Abdomen: Soft nontender bowel sounds present.  Skin: No rash.  Musculoskeletal: No edema.  Psychiatric: Appears normal.  Neurologic: Alert awake oriented to time place and person. Moves all extremities.  Labs on Admission:  Basic Metabolic Panel:  Recent Labs Lab 10/28/14 1811 10/28/14 2256  NA 142  --   K 2.6*  --   CL 105  --   CO2 28  --   GLUCOSE 118*  --   BUN 7  --   CREATININE 0.49*  --   CALCIUM 8.8*  --   MG  --  1.4*   Liver Function Tests:  Recent Labs Lab 10/28/14 1811  AST 1519*  ALT 1339*  ALKPHOS 154*  BILITOT 11.0*  PROT 6.2*  ALBUMIN 2.7*    Recent Labs Lab 10/28/14 1811  LIPASE 47   No results for input(s): AMMONIA in the last 168 hours. CBC:  Recent Labs Lab 10/28/14 1811  WBC 6.2  HGB 13.5  HCT 39.5  MCV 90.4  PLT 83*   Cardiac Enzymes: No results for input(s): CKTOTAL, CKMB, CKMBINDEX, TROPONINI in the last 168 hours.  BNP (last 3 results) No results for input(s): BNP in the last 8760 hours.  ProBNP (last 3 results) No results for input(s): PROBNP in the last 8760 hours.  CBG: No results for input(s): GLUCAP in the last 168 hours.  Radiological Exams on Admission: US Abdomen  Complete  10/28/2014   CLINICAL DATA:  Abdominal pain, nausea vomiting started 1 day ago. Increased liver function tests.  EXAM: ULTRASOUND ABDOMEN COMPLETE  COMPARISON:  None.  FINDINGS: Gallbladder: The gallbladder is normally distended and contains a large amount of sludge. Gallbladder wall measures 3 mm.  Common bile duct: Diameter: 2 mm.  Liver: The liver demonstrates diffusely heterogeneous echogenicity. Normal hepatopetal portal venous flow. Sonographic Bruce Conner sign is negative.  IVC: No abnormality visualized.  Pancreas: Is not seen.  Spleen: Size and appearance within normal limits.  Right Kidney: Length: 11.7 cm. Echogenicity within normal limits. No mass or hydronephrosis visualized.  Left Kidney: Length: 8.8 cm. 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 Echogenicity within normal limits. No mass or hydronephrosis visualized.  Abdominal aorta: No evidence of aneurysmal dilation. Maximum diameter of the visualized proximal aorta is 2.6 cm.  Other findings: Minimal perihepatic abdominal ascites.  IMPRESSION: Diffusely heterogeneous echogenicity of the liver which may be seen with hepatic cirrhosis or fibrosis. Minimal perihepatic ascites.  Gallbladder sludge without evidence of acute cholecystitis.   Electronically Signed   By: Fidela Salisbury M.D.   On: 10/28/2014 20:46      Assessment/Plan Active Problems:   Elevated LFTs   Hypokalemia   Hypomagnesemia   Essential hypertension   Diarrhea   Thrombocytopenia (HCC)   1. Markedly elevated LFTs - in a nonobstructive pattern. Patient's INR is also elevated concerning for liver failure. Patient appears mildly confused at times so will check ammonia levels. At this time will closely follow LFTs. Acute hepatitis panel has been ordered. In addition we will check ANA and anti-mitochondrial antibodies. Since patient also complains of weight loss we will check CT abdomen and pelvis. Patient's abdomen is nontender and patient denies any abdominal pain. Tylenol  levels were negative. Further recommendations per GI Bruce Conner was notified. 2. Diarrhea - check stool studies and HIV status. Follow CT abdomen and pelvis. Patient states he has had a colonoscopy 9 years ago and as per the patient was unremarkable. 3. Hypokalemia and hypomagnesemia - probably from diarrhea. Close monitoring in telemetry. Replace and recheck. 4. Hypertension - continue home medications. 5. Thrombocytopenia - probably from cirrhosis. Follow CBC.   DVT Prophylaxis SCDs.  Code Status: Full code.  Family Communication: Discussed with patient.  Disposition Plan: Admit to inpatient.    Escarlet Saathoff N. [facility] Pager [phone].  If 7PM-7AM, please contact night-coverage [email or web address] Password [email or web address] 10/29/2014, 1:44 AM

## 2014-10-30 LAB — COMPREHENSIVE METABOLIC PANEL
ALBUMIN: 2.1 g/dL — AB (ref 3.5–5.0)
ALK PHOS: 136 U/L — AB (ref 38–126)
ALT: 987 U/L — AB (ref 17–63)
AST: 1071 U/L — AB (ref 15–41)
Anion gap: 3 — ABNORMAL LOW (ref 5–15)
CALCIUM: 8 mg/dL — AB (ref 8.9–10.3)
CHLORIDE: 110 mmol/L (ref 101–111)
CO2: 28 mmol/L (ref 22–32)
CREATININE: 0.41 mg/dL — AB (ref 0.61–1.24)
GFR calc non Af Amer: >60 mL/min (ref >60)
GLUCOSE: 79 mg/dL (ref 65–99)
Potassium: 3.1 mmol/L — ABNORMAL LOW (ref 3.5–5.1)
SODIUM: 141 mmol/L (ref 135–145)
Total Bilirubin: 9.9 mg/dL — ABNORMAL HIGH (ref 0.3–1.2)
Total Protein: 4.9 g/dL — ABNORMAL LOW (ref 6.5–8.1)

## 2014-10-30 LAB — HEPATITIS PANEL, ACUTE
HCV Ab: <0.1 s/co ratio (ref 0.0–0.9)
Hep A IgM: NEGATIVE
Hep B C IgM: NEGATIVE
Hepatitis B Surface Ag: NEGATIVE

## 2014-10-30 LAB — PROTIME-INR
INR: 3.1 — AB (ref 0.00–1.49)
Prothrombin Time: 31.4 seconds — ABNORMAL HIGH (ref 11.6–15.2)

## 2014-10-30 LAB — MITOCHONDRIAL ANTIBODIES: Mitochondrial M2 Ab, IgG: 18.1 U (ref 0.0–20.0)

## 2014-10-30 MED ORDER — POTASSIUM CHLORIDE CRYS ER 20 MEQ PO TBCR
40.0000 meq | EXTENDED_RELEASE_TABLET | Freq: Two times a day (BID) | ORAL | Status: DC
  Administered 2014-10-30 – 2014-11-02 (×8): 40 meq via ORAL
  Filled 2014-10-30 (×8): qty 2

## 2014-10-30 NOTE — Progress Notes (Signed)
[facility] PROGRESS NOTE  Bruce Conner DQQ:[id number] DOB: 16-Jun-1955 DOA: 10/28/2014 PCP: No primary care provider on file.  Assessment/Plan: 59 y/o male with PMH of HTN presented with diarrhea for 1 month and found to have abnormal LFTs and cirrhosis   1. Acute hepatitis. Liver cirrhosis of unclear etiology. Coagulopathy, thrombocytopenia. Abnormal LFTs of unclear etiology, but likely due to cirrhosis.  -hepatitis panel negative for acute viral hepatitis. Pend mitochondrial anbd, ANA. GI is following. Hold olmesartan due to GI side effect   -elevated ammonia with liver cirrhosis. Patient is having diarrhea without lactulose. We will recheck ammonia ain AM. May need rifaximin  2. Diarrhea in the setting of liver cirrhosis. Pend GI panel. Diarrhea is improving    3. HTN. Stable. Cont home regimen  4. Hypokalemia with diarrhea. Replace potasium   Code Status: full Family Communication: d/w patient (indicate person spoken with, relationship, and if by phone, the number) Disposition Plan: home pend clinical improvement. Likely home 24-48s with outpatient GI f/u    Consultants:  GI   Procedures:  none  Antibiotics:  none (indicate start date, and stop date if known)  HPI/Subjective: Alert, oriented   Objective: Filed Vitals:   10/30/14 0658  BP: 133/72  Pulse: 76  Temp: 98.6 F (37 C)  Resp: 18    Intake/Output Summary (Last 24 hours) at 10/30/14 1016 Last data filed at 10/29/14 2340  Gross per 24 hour  Intake   1730 ml  Output      0 ml  Net   1730 ml   Filed Weights   10/29/14 0012  Weight: 71.668 kg (158 lb)    Exam:   General:  No distress   Cardiovascular: s1,s2 rrr  Respiratory: CTA BL   Abdomen: soft, nt,nd   Musculoskeletal: no leg edema   Data Reviewed: Basic Metabolic Panel:  Recent Labs Lab 10/28/14 1811 10/28/14 2256 10/29/14 0443 10/30/14 0544  NA 142  --  144 141  K 2.6*  --  3.6 3.1*  CL 105  --  108 110  CO2 28   --  28 28  GLUCOSE 118*  --  88 79  BUN 7  --  7 <5*  CREATININE 0.49*  --  0.45* 0.41*  CALCIUM 8.8*  --  8.6* 8.0*  MG  --  1.4*  --   --    Liver Function Tests:  Recent Labs Lab 10/28/14 1811 10/29/14 0443 10/30/14 0544  AST 1519* 1393* 1071*  ALT 1339* 1236* 987*  ALKPHOS 154* 147* 136*  BILITOT 11.0* 10.7* 9.9*  PROT 6.2* 5.9* 4.9*  ALBUMIN 2.7* 2.5* 2.1*    Recent Labs Lab 10/28/14 1811  LIPASE 47    Recent Labs Lab 10/29/14 0732  AMMONIA 93*   CBC:  Recent Labs Lab 10/28/14 1811 10/29/14 0443  WBC 6.2 5.6  HGB 13.5 12.3*  HCT 39.5 36.3*  MCV 90.4 88.8  PLT 83* 86*   Cardiac Enzymes: No results for input(s): CKTOTAL, CKMB, CKMBINDEX, TROPONINI in the last 168 hours. BNP (last 3 results) No results for input(s): BNP in the last 8760 hours.  ProBNP (last 3 results) No results for input(s): PROBNP in the last 8760 hours.  CBG: No results for input(s): GLUCAP in the last 168 hours.  No results found for this or any previous visit (from the past 240 hour(s)).   Studies: US Abdomen Complete  10/28/2014   CLINICAL DATA:  Abdominal pain, nausea vomiting started 1 day ago. Increased  liver function tests.  EXAM: ULTRASOUND ABDOMEN COMPLETE  COMPARISON:  None.  FINDINGS: Gallbladder: The gallbladder is normally distended and contains a large amount of sludge. Gallbladder wall measures 3 mm.  Common bile duct: Diameter: 2 mm.  Liver: The liver demonstrates diffusely heterogeneous echogenicity. Normal hepatopetal portal venous flow. Sonographic Percell Miller sign is negative.  IVC: No abnormality visualized.  Pancreas: Is not seen.  Spleen: Size and appearance within normal limits.  Right Kidney: Length: 11.7 cm. Echogenicity within normal limits. No mass or hydronephrosis visualized.  Left Kidney: Length: 8.8 cm. 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 Echogenicity within normal limits. No mass or hydronephrosis visualized.  Abdominal aorta: No evidence of aneurysmal dilation.  Maximum diameter of the visualized proximal aorta is 2.6 cm.  Other findings: Minimal perihepatic abdominal ascites.  IMPRESSION: Diffusely heterogeneous echogenicity of the liver which may be seen with hepatic cirrhosis or fibrosis. Minimal perihepatic ascites.  Gallbladder sludge without evidence of acute cholecystitis.   Electronically Signed   By: Fidela Salisbury M.D.   On: 10/28/2014 20:46   Ct Abdomen Pelvis W Contrast  10/29/2014   CLINICAL DATA:  Persistent diarrhea for the last month. Weight loss. Elevated LFTs.  EXAM: CT ABDOMEN AND PELVIS WITH CONTRAST  TECHNIQUE: Multidetector CT imaging of the abdomen and pelvis was performed using the standard protocol following bolus administration of intravenous contrast.  CONTRAST:  168mL OMNIPAQUE IOHEXOL 300 MG/ML  SOLN  COMPARISON:  Ultrasound 10/28/2014  FINDINGS: Minimal bibasilar atelectasis, left greater than right. No effusions. Heart is normal size.  Heterogeneous low density throughout the liver, likely areas a geographic fatty infiltration. No definite focal abnormality. No biliary ductal dilatation. Gallbladder, spleen, pancreas, adrenals and right kidney are unremarkable. Left kidney is a in the low left pelvis posterior to the left bladder wall. No hydronephrosis or focal renal abnormality.  Stomach, large and small bowel are unremarkable. There is a small amount of free fluid around the liver. No free air or adenopathy. Urinary bladder is unremarkable. Aorta is mildly tortuous, non aneurysmal.  No acute bony abnormality or focal bone lesion.  IMPRESSION: Heterogeneous areas of low density throughout the liver, likely geographic fatty infiltration. Small amount of free fluid around the liver.  Bibasilar atelectasis, left greater than right.  Left pelvic kidney.   Electronically Signed   By: Rolm Baptise M.D.   On: 10/29/2014 10:52    Scheduled Meds: . amLODipine  10 mg Oral Daily  . antiseptic oral rinse  7 mL Mouth Rinse BID  . doxazosin   8 mg Oral Daily  . feeding supplement  1 Container Oral TID BM   Continuous Infusions:    Active Problems:   Elevated LFTs   Hypokalemia   Hypomagnesemia   Essential hypertension   Diarrhea   Thrombocytopenia (HCC)    Time spent: >365 minutes     Kinnie Feil  [facility] Pager [phone]. If 7PM-7AM, please contact night-coverage at [email or web address], password [email or web address] 10/30/2014, 10:16 AM  LOS: 2 days

## 2014-10-30 NOTE — Care Management Note (Signed)
Case Management Note  Patient Details  Name: Bruce Conner MRN: [id number] Date of Birth: 09-Apr-1955  Subjective/Objective: 59 y/o m admitted w/elevated LFT's.From  home.                   Action/Plan:d/c plan  home.   Expected Discharge Date:                  Expected Discharge Plan:  Home/Self Care  In-House Referral:     Discharge planning Services  CM Consult  Post Acute Care Choice:    Choice offered to:     DME Arranged:    DME Agency:     HH Arranged:    HH Agency:     Status of Service:  In process, will continue to follow  Medicare Important Message Given:    Date Medicare IM Given:    Medicare IM give by:    Date Additional Medicare IM Given:    Additional Medicare Important Message give by:     If discussed at [place] of Stay Meetings, dates discussed:    Additional Comments:  Dessa Phi, RN 10/30/2014, 3:29 PM

## 2014-10-30 NOTE — Progress Notes (Signed)
Bruce Conner 9:20 AM  Subjective: Patient without any GI complaints and is history was reviewed and his case discussed with my partner Dr. Jacinto Reap and he is eating fine and his bowel movements are okay  Objective: Vital signs stable afebrile no acute distress abdomen is soft nontender liver tests trending downward although INR increased acute hepatitis panel negative  Assessment: Acute hepatitis questionable etiology  Plan: If liver tests continue to decrease tomorrow without any new complaint he can probably be followed as an outpatient and either myself or Dr. B can see him in a week to recheck labs and decide on any further workup and plans  [facility] E  Pager [phone] After 5PM or if no answer call [phone]

## 2014-10-31 DIAGNOSIS — R7989 Other specified abnormal findings of blood chemistry: Secondary | ICD-10-CM

## 2014-10-31 DIAGNOSIS — R197 Diarrhea, unspecified: Secondary | ICD-10-CM

## 2014-10-31 DIAGNOSIS — E876 Hypokalemia: Secondary | ICD-10-CM

## 2014-10-31 LAB — GI PATHOGEN PANEL BY PCR, STOOL
C difficile toxin A/B: NOT DETECTED
CAMPYLOBACTER BY PCR: NOT DETECTED
Cryptosporidium by PCR: NOT DETECTED
E COLI (ETEC) LT/ST: NOT DETECTED
E coli (STEC): NOT DETECTED
E coli 0157 by PCR: NOT DETECTED
G LAMBLIA BY PCR: NOT DETECTED
NOROVIRUS G1/G2: NOT DETECTED
Rotavirus A by PCR: NOT DETECTED
SHIGELLA BY PCR: NOT DETECTED
Salmonella by PCR: NOT DETECTED

## 2014-10-31 LAB — PROTIME-INR
INR: 2.88 — ABNORMAL HIGH (ref 0.00–1.49)
Prothrombin Time: 29.7 seconds — ABNORMAL HIGH (ref 11.6–15.2)

## 2014-10-31 LAB — HEPATIC FUNCTION PANEL
ALK PHOS: 185 U/L — AB (ref 38–126)
ALT: 1235 U/L — AB (ref 17–63)
AST: 1314 U/L — AB (ref 15–41)
Albumin: 2.6 g/dL — ABNORMAL LOW (ref 3.5–5.0)
BILIRUBIN DIRECT: 6.5 mg/dL — AB (ref 0.1–0.5)
BILIRUBIN INDIRECT: 6.8 mg/dL — AB (ref 0.3–0.9)
BILIRUBIN TOTAL: 13.3 mg/dL — AB (ref 0.3–1.2)
Total Protein: 6.1 g/dL — ABNORMAL LOW (ref 6.5–8.1)

## 2014-10-31 LAB — AMMONIA: Ammonia: 119 umol/L — ABNORMAL HIGH (ref 9–35)

## 2014-10-31 LAB — BASIC METABOLIC PANEL
Anion gap: 7 (ref 5–15)
CHLORIDE: 108 mmol/L (ref 101–111)
CO2: 27 mmol/L (ref 22–32)
CREATININE: 0.34 mg/dL — AB (ref 0.61–1.24)
Calcium: 8.4 mg/dL — ABNORMAL LOW (ref 8.9–10.3)
GFR calc Af Amer: >60 mL/min (ref >60)
GFR calc non Af Amer: >60 mL/min (ref >60)
GLUCOSE: 57 mg/dL — AB (ref 65–99)
POTASSIUM: 3.2 mmol/L — AB (ref 3.5–5.1)
Sodium: 142 mmol/L (ref 135–145)

## 2014-10-31 LAB — FECAL FAT, QUALITATIVE
Fat Qual Neutral, Stl: NORMAL
Fat Qual Total, Stl: NORMAL

## 2014-10-31 NOTE — Progress Notes (Signed)
[place] 9:38 AM  Subjective: Patient doing well without any new complaints although he does have some episodic loose stools  Objective: Vital signs stable afebrile no acute distress abdomen is soft nontender unfortunately LFTs increased however INR decreased  Assessment: Hepatitis question etiology  Plan: Will draw additional autoimmune and anti-smooth muscle antibodies and might consider a trial of steroids or a transjugular liver biopsy by IR probably with FFP and would probably wait another day or 2 to determine if we should proceed with either of those and give his liver tests another day or 2 to see which direction they are going  [facility] E  Pager [phone] After 5PM or if no answer call [phone]

## 2014-10-31 NOTE — Progress Notes (Signed)
[facility] PROGRESS NOTE  Bruce Conner QQP:[id number] DOB: 1955-10-22 DOA: 10/28/2014 PCP: No primary care provider on file.  Assessment/Plan: 59 y/o male with PMH of HTN presented with diarrhea for 1 month and found to have abnormal LFTs and cirrhosis   1. Acute hepatitis.  -Transaminases trending up since yesterday's work, AST at 1314 and ALT at 1235, previously 1071 and 987 respectively. His ammonia level is also elevated at 119 and INR of 2.88. -hepatitis panel negative for acute viral hepatitis.  -On 10/27/2004 and he had right upper quadrant ultrasound that showed diffusely heterogeneous echogenicity of the liver which may represent hepatic cirrhosis or fibrosis. -On 10/29/2014 he had a CT scan of abdomen and pelvis with contrast that revealed heterogeneous areas of low density throughout the liver again likely related to fatty infiltration. -GI is following, considering a trial of steroids and possibly biopsy  2. Hyperammonemia.  -He did not appear to be encephalopathic, was awake alert appropriate during my counter. -Will hold off on ammonia lowering therapy for now.   2. Diarrhea -In setting of liver cirrhosis. Pend GI panel. Diarrhea is improving     3. HTN. Stable.  Cont home regimen   4. Hypokalemia  -Likely secondary to GI losses  -Replace potasium with potassium chloride  Code Status: full Family Communication: d/w patient (indicate person spoken with, relationship, and if by phone, the number) Disposition Plan: Anticipate discharge home in medically stable   Consultants:  GI   Procedures:  none  Antibiotics:  none (indicate start date, and stop date if known)  HPI/Subjective: Alert, oriented   Objective: Filed Vitals:   10/31/14 1357  BP: 129/69  Pulse:   Temp: 97.8 F (36.6 C)  Resp: 20    Intake/Output Summary (Last 24 hours) at 10/31/14 1518 Last data filed at 10/31/14 1356  Gross per 24 hour  Intake    360 ml  Output      0  ml  Net    360 ml   Filed Weights   10/29/14 0012  Weight: 71.668 kg (158 lb)    Exam:   General:  No distress   Cardiovascular: s1,s2 rrr  Respiratory: CTA BL   Abdomen: soft, nt,nd   Musculoskeletal: no leg edema   Data Reviewed: Basic Metabolic Panel:  Recent Labs Lab 10/28/14 1811 10/28/14 2256 10/29/14 0443 10/30/14 0544 10/31/14 0518  NA 142  --  144 141 142  K 2.6*  --  3.6 3.1* 3.2*  CL 105  --  108 110 108  CO2 28  --  28 28 27   GLUCOSE 118*  --  88 79 57*  BUN 7  --  7 <5* <5*  CREATININE 0.49*  --  0.45* 0.41* 0.34*  CALCIUM 8.8*  --  8.6* 8.0* 8.4*  MG  --  1.4*  --   --   --    Liver Function Tests:  Recent Labs Lab 10/28/14 1811 10/29/14 0443 10/30/14 0544 10/31/14 0811  AST 1519* 1393* 1071* 1314*  ALT 1339* 1236* 987* 1235*  ALKPHOS 154* 147* 136* 185*  BILITOT 11.0* 10.7* 9.9* 13.3*  PROT 6.2* 5.9* 4.9* 6.1*  ALBUMIN 2.7* 2.5* 2.1* 2.6*    Recent Labs Lab 10/28/14 1811  LIPASE 47    Recent Labs Lab 10/29/14 0732 10/31/14 0518  AMMONIA 93* 119*   CBC:  Recent Labs Lab 10/28/14 1811 10/29/14 0443  WBC 6.2 5.6  HGB 13.5 12.3*  HCT 39.5 36.3*  MCV 90.4 88.8  PLT 83* 86*   Cardiac Enzymes: No results for input(s): CKTOTAL, CKMB, CKMBINDEX, TROPONINI in the last 168 hours. BNP (last 3 results) No results for input(s): BNP in the last 8760 hours.  ProBNP (last 3 results) No results for input(s): PROBNP in the last 8760 hours.  CBG: No results for input(s): GLUCAP in the last 168 hours.  No results found for this or any previous visit (from the past 240 hour(s)).   Studies: No results found.  Scheduled Meds: . amLODipine  10 mg Oral Daily  . antiseptic oral rinse  7 mL Mouth Rinse BID  . doxazosin  8 mg Oral Daily  . feeding supplement  1 Container Oral TID BM  . potassium chloride  40 mEq Oral BID   Continuous Infusions:    Active Problems:   Elevated LFTs   Hypokalemia   Hypomagnesemia    Essential hypertension   Diarrhea   Thrombocytopenia (HCC)    Time spent: 25 minutes     Kelvin Cellar  [facility] Pager [phone]. If 7PM-7AM, please contact night-coverage at [email or web address], password [facility] 10/31/2014, 3:18 PM  LOS: 3 days

## 2014-11-01 LAB — COMPREHENSIVE METABOLIC PANEL
ALT: 998 U/L — ABNORMAL HIGH (ref 17–63)
AST: 934 U/L — ABNORMAL HIGH (ref 15–41)
Albumin: 2.1 g/dL — ABNORMAL LOW (ref 3.5–5.0)
Alkaline Phosphatase: 148 U/L — ABNORMAL HIGH (ref 38–126)
Anion gap: 6 (ref 5–15)
BUN: <5 mg/dL — ABNORMAL LOW (ref 6–20)
CHLORIDE: 110 mmol/L (ref 101–111)
CO2: 25 mmol/L (ref 22–32)
Calcium: 8.6 mg/dL — ABNORMAL LOW (ref 8.9–10.3)
Glucose, Bld: 88 mg/dL (ref 65–99)
POTASSIUM: 4.5 mmol/L (ref 3.5–5.1)
Sodium: 141 mmol/L (ref 135–145)
Total Bilirubin: 11.8 mg/dL — ABNORMAL HIGH (ref 0.3–1.2)
Total Protein: 5 g/dL — ABNORMAL LOW (ref 6.5–8.1)

## 2014-11-01 LAB — CBC WITH DIFFERENTIAL/PLATELET
BASOS PCT: 0 %
Basophils Absolute: 0 10*3/uL (ref 0.0–0.1)
EOS PCT: 4 %
Eosinophils Absolute: 0.2 10*3/uL (ref 0.0–0.7)
HCT: 35 % — ABNORMAL LOW (ref 39.0–52.0)
Hemoglobin: 11.8 g/dL — ABNORMAL LOW (ref 13.0–17.0)
LYMPHS ABS: 1.6 10*3/uL (ref 0.7–4.0)
Lymphocytes Relative: 33 %
MCH: 30.4 pg (ref 26.0–34.0)
MCHC: 33.7 g/dL (ref 30.0–36.0)
MCV: 90.2 fL (ref 78.0–100.0)
MONO ABS: 0.4 10*3/uL (ref 0.1–1.0)
Monocytes Relative: 9 %
NEUTROS PCT: 54 %
Neutro Abs: 2.6 10*3/uL (ref 1.7–7.7)
PLATELETS: 58 10*3/uL — AB (ref 150–400)
RBC: 3.88 MIL/uL — ABNORMAL LOW (ref 4.22–5.81)
RDW: 16.7 % — AB (ref 11.5–15.5)
WBC: 4.8 10*3/uL (ref 4.0–10.5)

## 2014-11-01 LAB — PROTIME-INR
INR: 3.42 — AB (ref 0.00–1.49)
PROTHROMBIN TIME: 33.8 s — AB (ref 11.6–15.2)

## 2014-11-01 NOTE — Progress Notes (Signed)
Per infection prevention, GI panel and C-Diff negative. D/C enteric precautions.

## 2014-11-01 NOTE — Progress Notes (Signed)
TBili better, transaminases better, renal fn nl but INR and plts worse.  Mental status nl, no asterixis.  Struggles w/ simple math (serial 2's) but ox3, alert, joking, coherent.  Eating very well, no further diarrhea.  I recomm referral to transplant center, or at least conferring w/ hepatologist on phone, given absence of Dx, for guidance and possibly transfer.   Pt is open to that.     Not inclined to do bx or steroids w/out hepatologists' input.  Bruce Conner, M.D. Pager [phone] If no answer or after 5 PM call [phone]

## 2014-11-01 NOTE — Progress Notes (Signed)
Nutrition Follow-up  DOCUMENTATION CODES:   Severe malnutrition in context of acute illness/injury  INTERVENTION:  - Will d/c Boost Breeze as intakes and appetite have improved - RD will continue to monitor for needs  NUTRITION DIAGNOSIS:   Inadequate oral intake related to acute illness, other (see comment) (diarrhea) as evidenced by per patient/family report.  GOAL:   Patient will meet greater than or equal to 90% of their needs  MONITOR:   PO intake, Supplement acceptance, Weight trends, Labs, I & O's  ASSESSMENT:   59 y.o. male with history of hypertension presents to the ER because of persistent diarrhea over the last 1 month. Patient states his diarrhea is more on eating solid food. Happens 2-3 times a day. Denies any recent use of antibiotics or sick contacts. Denies any recent travel. Denies any fever chills or abdominal pain. Denies any nausea vomiting. Patient states he may have lost at least 30 pounds. In the ER patient was found to have markedly elevated LFTs. Sonogram of abdomen shows gallbladder sludge with possible cirrhosis. On-call gastroenterologist Dr. Collene Mares was consulted and patient has been admitted for further management. Patient denies taking any NSAIDs or Tylenol. Patient states his diarrhea is watery denies any blood in the diarrhea. Lab work shows a markedly elevated LFTs with elevated INR and thrombocytopenia. Patient's last alcohol drink was in 1998 until patient used to drink alcohol once or twice a month.  10/13 Per chart review, pt ate 50% breakfast and 100% lunch and dinner 10/11 and 60% breakfast, 85% lunch, and 100% dinner yesterday (10/12). Pt reports appetite has been improving and that he has been ordering a variety of foods and not only consuming bland foods, as he had been doing PTA and at the beginning of admission.  He states that for breakfast this AM he had eggs, grits, and toast and he denies abdominal pain or nausea after eating.   Meeting  needs. Will d/c supplement. Medications reviewed. Labs reviewed; BUN <5, creatinine low, Ca: 8.6 mg/dl, LFTs elevated.    10/10 Pt ate ~50% of breakfast which consisted of rice krispies, applesauce, and plain toast. Pt denies abdominal pain or nausea at this time and states that he was not experiencing this PTA. He states he had one episode of emesis yesterday (10/9) but no previous or subsequent episodes. He states that for the past 1 month he has been having diarrhea. He states that this was exacerbated by heavily spiced or greasy/fatty foods. He has been able to tolerate bland foods (he gives the examples of plain baked potato or mashed potatoes, plain white rice, applesauce, and plain toast) without issue/exacerbation as well as fluids (which he states he was only drinking Gatorade, Sprite, and iced tea PTA).  Pt states that his weight had previously been stable but that he has lost 26 lbs in the past month. He states UBW of 186 lbs. Per chart review, pt has lost 28 lbs (15% body weight) in the past 1 month. Mild muscle and fat wasting noted.  Diet Order:  Diet Heart Room service appropriate?: Yes; Fluid consistency:: Thin  Skin:  Reviewed, no issues  Last BM:  10/12  Height:   Ht Readings from Last 1 Encounters:  10/29/14 5\' 5"  (1.651 m)    Weight:   Wt Readings from Last 1 Encounters:  10/29/14 158 lb (71.668 kg)    Ideal Body Weight:  61.82 kg (kg)  BMI:  Body mass index is 26.29 kg/(m^2).  Estimated Nutritional Needs:  Kcal:  1790-1990  Protein:  70-80 grams  Fluid:  >/= 2.5 L/day  EDUCATION NEEDS:   No education needs identified at this time      Jarome Matin, RD, LDN Inpatient Clinical Dietitian Pager # [phone] After hours/weekend pager # [phone]

## 2014-11-02 DIAGNOSIS — K7200 Acute and subacute hepatic failure without coma: Principal | ICD-10-CM

## 2014-11-02 DIAGNOSIS — K7290 Hepatic failure, unspecified without coma: Secondary | ICD-10-CM | POA: Diagnosis present

## 2014-11-02 DIAGNOSIS — D696 Thrombocytopenia, unspecified: Secondary | ICD-10-CM

## 2014-11-02 LAB — CBC
HEMATOCRIT: 33.4 % — AB (ref 39.0–52.0)
HEMOGLOBIN: 11.4 g/dL — AB (ref 13.0–17.0)
MCH: 30.5 pg (ref 26.0–34.0)
MCHC: 34.1 g/dL (ref 30.0–36.0)
MCV: 89.3 fL (ref 78.0–100.0)
Platelets: UNDETERMINED 10*3/uL (ref 150–400)
RBC: 3.74 MIL/uL — AB (ref 4.22–5.81)
RDW: 16.5 % — ABNORMAL HIGH (ref 11.5–15.5)
WBC: 3.6 10*3/uL — ABNORMAL LOW (ref 4.0–10.5)

## 2014-11-02 LAB — BASIC METABOLIC PANEL
ANION GAP: 5 (ref 5–15)
BUN: 5 mg/dL — ABNORMAL LOW (ref 6–20)
CHLORIDE: 109 mmol/L (ref 101–111)
CO2: 25 mmol/L (ref 22–32)
Calcium: 8.6 mg/dL — ABNORMAL LOW (ref 8.9–10.3)
Creatinine, Ser: <0.3 mg/dL — ABNORMAL LOW (ref 0.61–1.24)
Glucose, Bld: 71 mg/dL (ref 65–99)
POTASSIUM: 3.6 mmol/L (ref 3.5–5.1)
SODIUM: 139 mmol/L (ref 135–145)

## 2014-11-02 LAB — HEPATIC FUNCTION PANEL
ALBUMIN: 2.1 g/dL — AB (ref 3.5–5.0)
ALK PHOS: 146 U/L — AB (ref 38–126)
ALT: 893 U/L — ABNORMAL HIGH (ref 17–63)
AST: 756 U/L — ABNORMAL HIGH (ref 15–41)
BILIRUBIN INDIRECT: 6.7 mg/dL — AB (ref 0.3–0.9)
Bilirubin, Direct: 6.4 mg/dL — ABNORMAL HIGH (ref 0.1–0.5)
TOTAL PROTEIN: 5 g/dL — AB (ref 6.5–8.1)
Total Bilirubin: 13.1 mg/dL — ABNORMAL HIGH (ref 0.3–1.2)

## 2014-11-02 LAB — ANTI-SMOOTH MUSCLE ANTIBODY, IGG: F-ACTIN AB IGG: 24 U — AB (ref 0–19)

## 2014-11-02 LAB — ANTINUCLEAR ANTIBODIES, IFA: ANA Ab, IFA: NEGATIVE

## 2014-11-02 LAB — PROTIME-INR
INR: 3.49 — AB (ref 0.00–1.49)
PROTHROMBIN TIME: 34.3 s — AB (ref 11.6–15.2)

## 2014-11-02 LAB — MITOCHONDRIAL ANTIBODIES: Mitochondrial M2 Ab, IgG: 18.9 Units (ref 0.0–20.0)

## 2014-11-02 MED ORDER — ACETYLCYSTEINE 200 MG/ML IV SOLN
15.0000 mg/kg/h | INTRAVENOUS | Status: DC
  Administered 2014-11-02: 15 mg/kg/h via INTRAVENOUS
  Filled 2014-11-02: qty 200

## 2014-11-02 MED ORDER — ACETYLCYSTEINE LOAD VIA INFUSION
150.0000 mg/kg | Freq: Once | INTRAVENOUS | Status: AC
  Administered 2014-11-02: 10755 mg via INTRAVENOUS
  Filled 2014-11-02: qty 269

## 2014-11-02 NOTE — Discharge Summary (Addendum)
Physician Discharge Summary  Bruce Conner:[id number] DOB: May 17, 1955 DOA: 10/28/2014  PCP: No primary care provider on file.  Admit date: 10/28/2014 Discharge date: 11/02/2014  Time spent: 35 minutes  Recommendations for Outpatient Follow-up:  1. Patient transferred to [facility] in [place] no carotid to be evaluated by hepatology   Discharge Diagnoses:  Active Problems:   Elevated LFTs   Hypokalemia   Hypomagnesemia   Essential hypertension   Diarrhea   Thrombocytopenia (HCC)   Liver failure (HCC)   Acute liver failure  Severe protein calorie malnutrition evidenced by 30 pound weight loss  Discharge Condition: Stable  Filed Weights   10/29/14 0012  Weight: 71.668 kg (158 lb)    History of present illness:  Bruce Conner is a 59 y.o. male with history of hypertension presents to the ER because of persistent diarrhea over the last 1 month. Patient states his diarrhea is more on eating solid food. Happens 2-3 times a day. Denies any recent use of antibiotics or sick contacts. Denies any recent travel. Denies any fever chills or abdominal pain. Denies any nausea vomiting. Patient states he may have lost at least 30 pounds. In the ER patient was found to have markedly elevated LFTs. Sonogram of abdomen shows gallbladder sludge with possible cirrhosis. On-call gastroenterologist Dr. Collene Mares was consulted and patient has been admitted for further management. Patient denies taking any NSAIDs or Tylenol. Patient states his diarrhea is watery denies any blood in the diarrhea. Lab work shows a markedly elevated LFTs with elevated INR and thrombocytopenia. Patient's last alcohol drink was in 1998 until patient used to drink alcohol once or twice a month.  Hospital Course:  [name] is a 59 year old gentleman with a past medical history of hypertension, admitted to the medicine service on 10/29/2014 at which time he presented with complaints of diarrhea over the past  3-4 weeks. Initial lab work revealed markedly elevated transaminases with an AST of 1512, ALT of 1339, total bilirubin of 11, INR at 2.6 with PT of 27.7. He was further worked up with a CT scan of abdomen and pelvis with IV contrast that only showed fatty infiltration of liver without evidence of biliary disease. This was further worked up with right upper quadrant ultrasound that revealed heterogeneous echogenicity of the liver consistent with cirrhosis or fibrosis. There is gallbladder sludge without evidence of acute cystitis. GI was consulted. Workup for his liver failure included acute hepatitis panel, , back negative for hepatitis A, hepatitis B, hepatitis C. HIV screen negative as well. Tylenol level negative. Workup for autoimmune hepatitis sets, ANA and antimitochondrial anti-bodies negative so far. Patient's only past medical history is hypertension. He denies alcohol abuse, reporting the last time he drank was in 1998. Doubt it's related to nonalcoholic steatohepatitis as he has a weight of 71 kg without metabolic syndrome. He reported that his only 2 medications in the outpatient setting were Azor 10/40 one tablet by mouth daily and Cardura 8 mg by mouth daily. He denied over-the-counter medications. He also denied recent travel. There was discussion about possibly initiating systemic steroids and obtaining a liver biopsy. Case discussed with Dr.Buccini of GI who thought it would be prudent for patient to be transferred to tertiary care center to be evaluated by hepatology. Throughout his hospitalization his liver enzymes have trended down however INR has trended up. On 11/02/2014 he had an AST of 756 with an ALT of 893 and total bilirubin of 13.1. His INR was 3.49 with PTT  of 34.3. Dr. Cristina Gong spoke with hepatology at [facility] in [place] on 11/02/2014 who agreed that patient would benefit from a transfer to that facility to be evaluated. They recommended starting N-acetylcysteine.  Currently awaiting transfer to [facility].  Consultations:  [facility] hepatology  Discharge Exam: Filed Vitals:   11/02/14 1430  BP: 125/72  Pulse: 87  Temp: 98.2 F (36.8 C)  Resp: 20    General: Patient is notably jaundiced, however awake and alert oriented 3 Cardiovascular: Regular rate and rhythm normal S1-S2 Respiratory: Normal respiratory effort lungs are clear to auscultation bilaterally Abdomen: Soft nontender nondistended  Discharge Instructions   Discharge Instructions    Call MD for:  difficulty breathing, headache or visual disturbances    Complete by:  As directed      Call MD for:  extreme fatigue    Complete by:  As directed      Call MD for:  hives    Complete by:  As directed      Call MD for:  persistant dizziness or light-headedness    Complete by:  As directed      Call MD for:  persistant nausea and vomiting    Complete by:  As directed      Call MD for:  redness, tenderness, or signs of infection (pain, swelling, redness, odor or green/yellow discharge around incision site)    Complete by:  As directed      Call MD for:  severe uncontrolled pain    Complete by:  As directed      Call MD for:  temperature >100.4    Complete by:  As directed      Call MD for:    Complete by:  As directed      Diet - low sodium heart healthy    Complete by:  As directed      Increase activity slowly    Complete by:  As directed           Current Discharge Medication List    STOP taking these medications     AZOR 10-40 MG tablet      doxazosin (CARDURA) 8 MG tablet        No Known Allergies    The results of significant diagnostics from this hospitalization (including imaging, microbiology, ancillary and laboratory) are listed below for reference.    Significant Diagnostic Studies: US Abdomen Complete  10/28/2014  CLINICAL DATA:  Abdominal pain, nausea vomiting started 1 day ago. Increased liver function tests. EXAM:  ULTRASOUND ABDOMEN COMPLETE COMPARISON:  None. FINDINGS: Gallbladder: The gallbladder is normally distended and contains a large amount of sludge. Gallbladder wall measures 3 mm. Common bile duct: Diameter: 2 mm. Liver: The liver demonstrates diffusely heterogeneous echogenicity. Normal hepatopetal portal venous flow. Sonographic Percell Miller sign is negative. IVC: No abnormality visualized. Pancreas: Is not seen. Spleen: Size and appearance within normal limits. Right Kidney: Length: 11.7 cm. Echogenicity within normal limits. No mass or hydronephrosis visualized. Left Kidney: Length: 8.8 cm. 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 Echogenicity within normal limits. No mass or hydronephrosis visualized. Abdominal aorta: No evidence of aneurysmal dilation. Maximum diameter of the visualized proximal aorta is 2.6 cm. Other findings: Minimal perihepatic abdominal ascites. IMPRESSION: Diffusely heterogeneous echogenicity of the liver which may be seen with hepatic cirrhosis or fibrosis. Minimal perihepatic ascites. Gallbladder sludge without evidence of acute cholecystitis. Electronically Signed   By: Fidela Salisbury M.D.   On: 10/28/2014 20:46  Ct Abdomen Pelvis W Contrast  10/29/2014  CLINICAL DATA:  Persistent diarrhea for the last month. Weight loss. Elevated LFTs. EXAM: CT ABDOMEN AND PELVIS WITH CONTRAST TECHNIQUE: Multidetector CT imaging of the abdomen and pelvis was performed using the standard protocol following bolus administration of intravenous contrast. CONTRAST:  149mL OMNIPAQUE IOHEXOL 300 MG/ML  SOLN COMPARISON:  Ultrasound 10/28/2014 FINDINGS: Minimal bibasilar atelectasis, left greater than right. No effusions. Heart is normal size. Heterogeneous low density throughout the liver, likely areas a geographic fatty infiltration. No definite focal abnormality. No biliary ductal dilatation. Gallbladder, spleen, pancreas, adrenals and right kidney are unremarkable. Left kidney is a in the low left pelvis  posterior to the left bladder wall. No hydronephrosis or focal renal abnormality. Stomach, large and small bowel are unremarkable. There is a small amount of free fluid around the liver. No free air or adenopathy. Urinary bladder is unremarkable. Aorta is mildly tortuous, non aneurysmal. No acute bony abnormality or focal bone lesion. IMPRESSION: Heterogeneous areas of low density throughout the liver, likely geographic fatty infiltration. Small amount of free fluid around the liver. Bibasilar atelectasis, left greater than right. Left pelvic kidney. Electronically Signed   By: Rolm Baptise M.D.   On: 10/29/2014 10:52    Microbiology: No results found for this or any previous visit (from the past 240 hour(s)).   Labs: Basic Metabolic Panel:  Recent Labs Lab 10/28/14 2256 10/29/14 0443 10/30/14 0544 10/31/14 0518 11/01/14 0500 11/02/14 0446  NA  --  144 141 142 141 139  K  --  3.6 3.1* 3.2* 4.5 3.6  CL  --  108 110 108 110 109  CO2  --  28 28 27 25 25   GLUCOSE  --  88 79 57* 88 71  BUN  --  7 <5* <5* <5* 5*  CREATININE  --  0.45* 0.41* 0.34* <0.30* <0.30*  CALCIUM  --  8.6* 8.0* 8.4* 8.6* 8.6*  MG 1.4*  --   --   --   --   --    Liver Function Tests:  Recent Labs Lab 10/29/14 0443 10/30/14 0544 10/31/14 0811 11/01/14 0500 11/02/14 0446  AST 1393* 1071* 1314* 934* 756*  ALT 1236* 987* 1235* 998* 893*  ALKPHOS 147* 136* 185* 148* 146*  BILITOT 10.7* 9.9* 13.3* 11.8* 13.1*  PROT 5.9* 4.9* 6.1* 5.0* 5.0*  ALBUMIN 2.5* 2.1* 2.6* 2.1* 2.1*    Recent Labs Lab 10/28/14 1811  LIPASE 47    Recent Labs Lab 10/29/14 0732 10/31/14 0518  AMMONIA 93* 119*   CBC:  Recent Labs Lab 10/28/14 1811 10/29/14 0443 11/01/14 0500 11/02/14 0446  WBC 6.2 5.6 4.8 3.6*  NEUTROABS  --   --  2.6  --   HGB 13.5 12.3* 11.8* 11.4*  HCT 39.5 36.3* 35.0* 33.4*  MCV 90.4 88.8 90.2 89.3  PLT 83* 86* 58* PLATELET CLUMPS NOTED ON SMEAR, UNABLE TO ESTIMATE   Cardiac Enzymes: No results  for input(s): CKTOTAL, CKMB, CKMBINDEX, TROPONINI in the last 168 hours. BNP: BNP (last 3 results) No results for input(s): BNP in the last 8760 hours.  ProBNP (last 3 results) No results for input(s): PROBNP in the last 8760 hours.  CBG: No results for input(s): GLUCAP in the last 168 hours.     SignedKelvin Cellar  [facility] 11/02/2014, 4:08 PM

## 2014-11-02 NOTE — Progress Notes (Signed)
[facility] PROGRESS NOTE  CHAPIN ARDUINI OEU:[id number] DOB: 1955/10/12 DOA: 10/28/2014 PCP: No primary care provider on file.  Assessment/Plan: 59 y/o male with PMH of HTN presented with diarrhea for 1 month and found to have abnormal LFTs and cirrhosis   1. Acute hepatitis/Liver Failure.  -Transaminases trending up since yesterday's work, AST at 1314 and ALT at 1235, previously 1071 and 987 respectively. His ammonia level is also elevated at 119 and INR of 2.88. -hepatitis panel negative for acute viral hepatitis.  -On 10/27/2004 and he had right upper quadrant ultrasound that showed diffusely heterogeneous echogenicity of the liver which may represent hepatic cirrhosis or fibrosis. -On 10/29/2014 he had a CT scan of abdomen and pelvis with contrast that revealed heterogeneous areas of low density throughout the liver again likely related to fatty infiltration. -Case discussed with Dr Cristina Gong of GI who spoke with Hepatology at [facility] regarding liver failure. He felt patient would benefit from a transfer to [facility] to be evaluated by Hepatology. N-acetylcysteine was recommended by Hepatologist as there is data that this may improve outcomes. Will hold off on steroid therapy until evaluated in [place].  2. Hyperammonemia.  -Does not have evidence of encephalopathy. -Will hold off on ammonia lowering therapy  2. Diarrhea -In setting of liver cirrhosis. -GI panel is negative. Hepatitis panel negative. -On 11/02/2014 he reports significant improvement to his diarrheal symptoms.      3. HTN. Stable.  Cont home regimen   4. Hypokalemia  -Likely secondary to GI losses  -A.m. labs showing potassium of 3.6  Code Status: full code Family Communication: d/w patient (indicate person spoken with, relationship, and if by phone, the number) Disposition Plan: Plan to transfer him to [facility] in [place]   Consultants:  GI   Hepatology at [facility]   HPI/Subjective: Alert, oriented, has no complaints. Reports diarrhea is better  Objective: Filed Vitals:   11/02/14 1430  BP: 125/72  Pulse: 87  Temp: 98.2 F (36.8 C)  Resp: 20    Intake/Output Summary (Last 24 hours) at 11/02/14 1556 Last data filed at 11/01/14 1800  Gross per 24 hour  Intake    240 ml  Output      0 ml  Net    240 ml   Filed Weights   10/29/14 0012  Weight: 71.668 kg (158 lb)    Exam:   General:  No distress, he is awake and alert oriented 3   Cardiovascular: s1,s2 rrr  Respiratory: CTA BL   Abdomen: soft, nt,nd   Musculoskeletal: no leg edema   Data Reviewed: Basic Metabolic Panel:  Recent Labs Lab 10/28/14 2256 10/29/14 0443 10/30/14 0544 10/31/14 0518 11/01/14 0500 11/02/14 0446  NA  --  144 141 142 141 139  K  --  3.6 3.1* 3.2* 4.5 3.6  CL  --  108 110 108 110 109  CO2  --  28 28 27 25 25   GLUCOSE  --  88 79 57* 88 71  BUN  --  7 <5* <5* <5* 5*  CREATININE  --  0.45* 0.41* 0.34* <0.30* <0.30*  CALCIUM  --  8.6* 8.0* 8.4* 8.6* 8.6*  MG 1.4*  --   --   --   --   --    Liver Function Tests:  Recent Labs Lab 10/29/14 0443 10/30/14 0544 10/31/14 0811 11/01/14 0500 11/02/14 0446  AST 1393* 1071* 1314* 934* 756*  ALT 1236* 987* 1235* 998* 893*  ALKPHOS 147* 136* 185* 148* 146*  BILITOT 10.7* 9.9* 13.3* 11.8* 13.1*  PROT 5.9* 4.9* 6.1* 5.0* 5.0*  ALBUMIN 2.5* 2.1* 2.6* 2.1* 2.1*    Recent Labs Lab 10/28/14 1811  LIPASE 47    Recent Labs Lab 10/29/14 0732 10/31/14 0518  AMMONIA 93* 119*   CBC:  Recent Labs Lab 10/28/14 1811 10/29/14 0443 11/01/14 0500 11/02/14 0446  WBC 6.2 5.6 4.8 3.6*  NEUTROABS  --   --  2.6  --   HGB 13.5 12.3* 11.8* 11.4*  HCT 39.5 36.3* 35.0* 33.4*  MCV 90.4 88.8 90.2 89.3  PLT 83* 86* 58* PLATELET CLUMPS NOTED ON SMEAR, UNABLE TO ESTIMATE   Cardiac Enzymes: No results for input(s): CKTOTAL, CKMB, CKMBINDEX, TROPONINI in the last 168 hours. BNP (last 3  results) No results for input(s): BNP in the last 8760 hours.  ProBNP (last 3 results) No results for input(s): PROBNP in the last 8760 hours.  CBG: No results for input(s): GLUCAP in the last 168 hours.  No results found for this or any previous visit (from the past 240 hour(s)).   Studies: No results found.  Scheduled Meds: . acetylcysteine  150 mg/kg Intravenous Once  . amLODipine  10 mg Oral Daily  . antiseptic oral rinse  7 mL Mouth Rinse BID  . doxazosin  8 mg Oral Daily  . potassium chloride  40 mEq Oral BID   Continuous Infusions: . acetylcysteine      Active Problems:   Elevated LFTs   Hypokalemia   Hypomagnesemia   Essential hypertension   Diarrhea   Thrombocytopenia (HCC)   Liver failure ([place])   Acute liver failure    Time spent:     Kelvin Cellar  [facility] Pager [phone]. If 7PM-7AM, please contact night-coverage at [email or web address], password [facility] 11/02/2014, 3:56 PM  LOS: 5 days

## 2014-11-02 NOTE — Progress Notes (Signed)
Bruce Conner 9:27 AM  Subjective: Patient without any complaints and eating fine and no lower bowel complaints today and his case was discussed with my partner Dr. 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

## 2014-11-02 NOTE — Progress Notes (Signed)
[facility] PROGRESS NOTE  Bruce Conner KGM:[id number] DOB: [birth year] DOA: 10/28/2014 PCP: No primary care provider on file.  Assessment/Plan: 59 y/o male with PMH of HTN presented with diarrhea for 1 month and found to have abnormal LFTs and cirrhosis   1. Acute hepatitis.  -Transaminases trending up since yesterday's work, AST at 1314 and ALT at 1235, previously 1071 and 987 respectively. His ammonia level is also elevated at 119 and INR of 2.88. -hepatitis panel negative for acute viral hepatitis.  -On 10/27/2004 and he had right upper quadrant ultrasound that showed diffusely heterogeneous echogenicity of the liver which may represent hepatic cirrhosis or fibrosis. -On 10/29/2014 he had a CT scan of abdomen and pelvis with contrast that revealed heterogeneous areas of low density throughout the liver again likely related to fatty infiltration. -I called [facility] in [place] spoke with Hepatology. Agreed to see patient in consultation however was not accepted by medicine.   2. Hyperammonemia.  -Does not have evidence of encephalopathy. -Will hold off on ammonia lowering therapy  2. Diarrhea -In setting of liver cirrhosis. -GI panel is negative. Hepatitis panel negative.      3. HTN. Stable.  Cont home regimen   4. Hypokalemia  -Likely secondary to GI losses  -Replace potasium with potassium chloride  Code Status: full Family Communication: d/w patient (indicate person spoken with, relationship, and if by phone, the number) Disposition Plan: Anticipate discharge home in medically stable   Consultants:  GI   Hepatology at [facility]   HPI/Subjective: Alert, oriented, has no complaints.   Objective: Filed Vitals:   11/02/14 0516  BP: 139/81  Pulse: 79  Temp: 98.1 F (36.7 C)  Resp: 18    Intake/Output Summary (Last 24 hours) at 11/02/14 1533 Last data filed at 11/01/14 1800  Gross per 24 hour  Intake    240 ml  Output      0 ml  Net    240 ml    Filed Weights   10/29/14 0012  Weight: 71.668 kg (158 lb)    Exam:   General:  No distress   Cardiovascular: s1,s2 rrr  Respiratory: CTA BL   Abdomen: soft, nt,nd   Musculoskeletal: no leg edema   Data Reviewed: Basic Metabolic Panel:  Recent Labs Lab 10/28/14 2256 10/29/14 0443 10/30/14 0544 10/31/14 0518 11/01/14 0500 11/02/14 0446  NA  --  144 141 142 141 139  K  --  3.6 3.1* 3.2* 4.5 3.6  CL  --  108 110 108 110 109  CO2  --  28 28 27 25 25   GLUCOSE  --  88 79 57* 88 71  BUN  --  7 <5* <5* <5* 5*  CREATININE  --  0.45* 0.41* 0.34* <0.30* <0.30*  CALCIUM  --  8.6* 8.0* 8.4* 8.6* 8.6*  MG 1.4*  --   --   --   --   --    Liver Function Tests:  Recent Labs Lab 10/29/14 0443 10/30/14 0544 10/31/14 0811 11/01/14 0500 11/02/14 0446  AST 1393* 1071* 1314* 934* 756*  ALT 1236* 987* 1235* 998* 893*  ALKPHOS 147* 136* 185* 148* 146*  BILITOT 10.7* 9.9* 13.3* 11.8* 13.1*  PROT 5.9* 4.9* 6.1* 5.0* 5.0*  ALBUMIN 2.5* 2.1* 2.6* 2.1* 2.1*    Recent Labs Lab 10/28/14 1811  LIPASE 47    Recent Labs Lab 10/29/14 0732 10/31/14 0518  AMMONIA 93* 119*   CBC:  Recent Labs Lab 10/28/14 1811 10/29/14 0443 11/01/14  0500 11/02/14 0446  WBC 6.2 5.6 4.8 3.6*  NEUTROABS  --   --  2.6  --   HGB 13.5 12.3* 11.8* 11.4*  HCT 39.5 36.3* 35.0* 33.4*  MCV 90.4 88.8 90.2 89.3  PLT 83* 86* 58* PLATELET CLUMPS NOTED ON SMEAR, UNABLE TO ESTIMATE   Cardiac Enzymes: No results for input(s): CKTOTAL, CKMB, CKMBINDEX, TROPONINI in the last 168 hours. BNP (last 3 results) No results for input(s): BNP in the last 8760 hours.  ProBNP (last 3 results) No results for input(s): PROBNP in the last 8760 hours.  CBG: No results for input(s): GLUCAP in the last 168 hours.  No results found for this or any previous visit (from the past 240 hour(s)).   Studies: No results found.  Scheduled Meds: . amLODipine  10 mg Oral Daily  . antiseptic oral rinse  7 mL Mouth  Rinse BID  . doxazosin  8 mg Oral Daily  . potassium chloride  40 mEq Oral BID   Continuous Infusions:    Active Problems:   Elevated LFTs   Hypokalemia   Hypomagnesemia   Essential hypertension   Diarrhea   Thrombocytopenia (HCC)   Liver failure ([place])    Time spent: 15 minutes     Kelvin Cellar  [facility] Pager [phone]. If 7PM-7AM, please contact night-coverage at [email or web address], password [facility] 11/02/2014, 3:33 PM  LOS: 5 days

## 2014-11-02 NOTE — Progress Notes (Signed)
Report called to [place] report to [place], Therapist, sports. Patient notified of transfer and will be transported via EMS.  Cathie Hoops, RN

## 2014-11-02 NOTE — Progress Notes (Signed)
MEDICATION RELATED CONSULT NOTE - INITIAL   Pharmacy Consult for acetadote Indication: hepatic failure  No Known Allergies  Patient Measurements: Height: 5\' 5"  (165.1 cm) Weight: 158 lb (71.668 kg) IBW/kg (Calculated) : 61.5   Vital Signs: Temp: 98.2 F (36.8 C) (10/14 1430) Temp Source: Oral (10/14 1430) BP: 125/72 mmHg (10/14 1430) Pulse Rate: 87 (10/14 1430) Intake/Output from previous day: 10/13 0701 - 10/14 0700 In: 720 [P.O.:720] Out: -  Intake/Output from this shift:    Labs:  Recent Labs  10/31/14 0518 10/31/14 0811 11/01/14 0500 11/02/14 0446  WBC  --   --  4.8 3.6*  HGB  --   --  11.8* 11.4*  HCT  --   --  35.0* 33.4*  PLT  --   --  58* PLATELET CLUMPS NOTED ON SMEAR, UNABLE TO ESTIMATE  CREATININE 0.34*  --  <0.30* <0.30*  ALBUMIN  --  2.6* 2.1* 2.1*  PROT  --  6.1* 5.0* 5.0*  AST  --  1314* 934* 756*  ALT  --  1235* 998* 893*  ALKPHOS  --  185* 148* 146*  BILITOT  --  13.3* 11.8* 13.1*  BILIDIR  --  6.5*  --  6.4*  IBILI  --  6.8*  --  6.7*   CrCl cannot be calculated (Patient has no serum creatinine result on file.).   Microbiology: No results found for this or any previous visit (from the past 720 hour(s)).  Medical History: Past Medical History  Diagnosis Date  . Hypertension     Assessment: 59 y/o male with PMH of HTN presented with diarrhea for 1 month and found to have abnormal LFTs and cirrhosis.   On 10/27/2004 and he had right upper quadrant ultrasound that showed diffusely heterogeneous echogenicity of the liver which may represent hepatic cirrhosis or fibrosis. Acetaminophen level < 10.  Pharmacy consulted to dose acetadote.  AST 756, ALT 893 both trending down Bili 6.4  Goal of Therapy:  Attempt to preserve liver function in non-overdose patient  Plan:  Load acetadote 150mg /kg then Start acetadote drip @ 15mg /kg/hr Did not consult poison control center as not an overdose pt Follow LFT's and GI recs  Dolly Rias  RPh 11/02/2014, 4:08 PM Pager [phone]

## 2014-11-12 DIAGNOSIS — Z944 Liver transplant status: Secondary | ICD-10-CM

## 2014-11-12 HISTORY — DX: Liver transplant status: Z94.4

## 2014-11-12 HISTORY — PX: LIVER TRANSPLANT: SHX410

## 2015-09-30 ENCOUNTER — Encounter: Payer: Self-pay | Admitting: Family

## 2015-09-30 ENCOUNTER — Ambulatory Visit (INDEPENDENT_AMBULATORY_CARE_PROVIDER_SITE_OTHER): Payer: 59 | Admitting: Family

## 2015-09-30 DIAGNOSIS — I10 Essential (primary) hypertension: Secondary | ICD-10-CM | POA: Diagnosis not present

## 2015-09-30 DIAGNOSIS — Z944 Liver transplant status: Secondary | ICD-10-CM

## 2015-09-30 NOTE — Progress Notes (Signed)
Subjective:    Patient ID: Bruce Conner, male    DOB: 17-Apr-1955, 60 y.o.   MRN: [id number]  Chief Complaint  Patient presents with  . Establish Care    has issues with leg and feet swelling, x3 weeks    HPI:  Bruce Conner is a 60 y.o. male who  has a past medical history of Hypertension and Liver failure ([place]). and presents today for an office visit to establish care.    1.) Liver transplant - Previously evaluated in the hospital in October 2016 with the chief complaints of abdominal pain, weakness, vomiting and diarrhea. Originally diagnosed with gastroenteritis. During his evaluation he was found to have elevated liver functions. 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 Indicates that he is currently stable and maintained on Prograf, Myfortic, and Bactrim. Reports taking the medications as prescribed and denies adverse side effects. No current abdominal pain, nausea, vomitting, constipation or diarrhea.   2.) Hypertension - Currently maintained on amlodipine, carvedilol and doxazosin. Reports taking the medication as prescribed and denies adverse side effects although has noted some mild lower extremity edema over the past 3 weeks. Modifying factors include compression socks and elevation of his feet when he can. Working on decreasing the sodium in his diet.   BP Readings from Last 3 Encounters:  09/30/15 140/82  11/02/14 (!) 166/90    No Known Allergies    No outpatient prescriptions prior to visit.   No facility-administered medications prior to visit.       Past Surgical History:  Procedure Laterality Date  . ABDOMINAL SURGERY    . LIVER TRANSPLANT        Past Medical History:  Diagnosis Date  . Hypertension   . Liver failure ([place])       Review of Systems  Constitutional: Negative for chills and fever.  Eyes:       Negative for changes in vision    Respiratory: Negative for cough, chest tightness, shortness of breath and wheezing.   Cardiovascular: Negative for chest pain, palpitations and leg swelling.  Neurological: Negative for dizziness, weakness, light-headedness and headaches.      Objective:    BP 140/82 (BP Location: Left Arm, Patient Position: Sitting, Cuff Size: Normal)   Pulse 61   Temp 97.9 F (36.6 C) (Oral)   Resp 16   Ht 5\' 5"  (1.651 m)   Wt 172 lb (78 kg)   SpO2 98%   BMI 28.62 kg/m  Nursing note and vital signs reviewed.  Physical Exam  Constitutional: He is oriented to person, place, and time. He appears well-developed and well-nourished. No distress.  HENT:  Mouth/Throat: Abnormal dentition. Dental caries present.  Cardiovascular: Normal rate, regular rhythm, normal heart sounds and intact distal pulses.   Pulmonary/Chest: Effort normal and breath sounds normal.  Abdominal: Normal appearance and bowel sounds are normal. He exhibits no mass. There is no hepatosplenomegaly. There is no tenderness. There is no rebound.  Neurological: He is alert and oriented to person, place, and time.  Skin: Skin is warm and dry.  Psychiatric: He has a normal mood and affect. His behavior is normal. Judgment and thought content normal.       Assessment & Plan:   Problem List Items Addressed This Visit      Cardiovascular and Mediastinum   Essential hypertension    Blood pressure appears stable with current regimen and  no adverse side effects or symptoms or end organ damage. Denies worst headache of life. Continue current dosage of carvedilol, amlodipine, and doxazosin. Lower extremity edema possibly related to sodium intake or amlodipine. Symptoms are improved since initial onset. Monitor blood pressure at home. Obtain records from most recent blood work to check kidney function.      Relevant Medications   aspirin EC 81 MG tablet   amLODipine (NORVASC) 10 MG tablet   doxazosin (CARDURA) 8 MG tablet   carvedilol  (COREG) 6.25 MG tablet     Digestive   Status post liver transplant [facility])    Status post liver transplant in October 2016 and stable with current medication regimen and followed by Dr. Claudia Pollock from [facility]. No current abdominal pain or other GI symptoms. Continue current dosage of Prograf, Bactrim, and Myfortic with changes per gastroenterology. Continue to monitor.       Relevant Medications   tacrolimus (PROGRAF) 1 MG capsule   mycophenolate (MYFORTIC) 360 MG TBEC EC tablet    Other Visit Diagnoses   None.      I am having Mr. Skillin maintain his tacrolimus, mycophenolate, sulfamethoxazole-trimethoprim, aspirin EC, Magnesium, amLODipine, doxazosin, omeprazole, and carvedilol.   Meds ordered this encounter  Medications  . tacrolimus (PROGRAF) 1 MG capsule    Sig: Take 1 mg by mouth 2 (two) times daily.  . mycophenolate (MYFORTIC) 360 MG TBEC EC tablet    Sig: Take 360 mg by mouth 2 (two) times daily.  Marland Kitchen sulfamethoxazole-trimethoprim (BACTRIM,SEPTRA) 400-80 MG tablet    Sig: Take 1 tablet by mouth 2 (two) times daily.  Marland Kitchen aspirin EC 81 MG tablet    Sig: Take 81 mg by mouth daily.  . Magnesium 400 MG CAPS    Sig: Take by mouth.  Marland Kitchen amLODipine (NORVASC) 10 MG tablet    Sig: Take 10 mg by mouth daily.  Marland Kitchen doxazosin (CARDURA) 8 MG tablet    Sig: Take 8 mg by mouth daily.  Marland Kitchen omeprazole (PRILOSEC) 20 MG capsule    Sig: Take 20 mg by mouth daily.  . carvedilol (COREG) 6.25 MG tablet    Sig: Take 6.25 mg by mouth 2 (two) times daily with a meal.     Follow-up: Return if symptoms worsen or fail to improve.  Mauricio Po, FNP

## 2015-09-30 NOTE — Assessment & Plan Note (Addendum)
Status post liver transplant in October 2016 and stable with current medication regimen and followed by Dr. Claudia Pollock from [facility]. No current abdominal pain or other GI symptoms. Continue current dosage of Prograf, Bactrim, and Myfortic with changes per gastroenterology. Continue to monitor.

## 2015-09-30 NOTE — Assessment & Plan Note (Signed)
Blood pressure appears stable with current regimen and no adverse side effects or symptoms or end organ damage. Denies worst headache of life. Continue current dosage of carvedilol, amlodipine, and doxazosin. Lower extremity edema possibly related to sodium intake or amlodipine. Symptoms are improved since initial onset. Monitor blood pressure at home. Obtain records from most recent blood work to check kidney function.

## 2015-09-30 NOTE — Patient Instructions (Signed)
Thank you for choosing [facility].  SUMMARY AND INSTRUCTIONS:  Continue to elevate your legs when seated, decrease the sodium in your diet and compression socks as needed.   Medication:  Please continue to take your medications as prescribed.  Labs:  Please obtain a copy of your most recent blood work so that we may place it in your chart.   Follow up:  Continue to follow up with Dr. Claudia Pollock as scheduled.   If your symptoms worsen or fail to improve, please contact our office for further instruction, or in case of emergency go directly to the emergency room at the closest medical facility.

## 2016-02-18 ENCOUNTER — Other Ambulatory Visit (INDEPENDENT_AMBULATORY_CARE_PROVIDER_SITE_OTHER): Payer: 59

## 2016-02-18 ENCOUNTER — Encounter: Payer: Self-pay | Admitting: Family

## 2016-02-18 ENCOUNTER — Ambulatory Visit (INDEPENDENT_AMBULATORY_CARE_PROVIDER_SITE_OTHER): Payer: 59 | Admitting: Family

## 2016-02-18 ENCOUNTER — Other Ambulatory Visit: Payer: Self-pay | Admitting: Family

## 2016-02-18 VITALS — BP 128/78 | HR 52 | Temp 98.3°F | Resp 16 | Ht 65.0 in | Wt 160.0 lb

## 2016-02-18 DIAGNOSIS — N183 Chronic kidney disease, stage 3 unspecified: Secondary | ICD-10-CM

## 2016-02-18 DIAGNOSIS — Z944 Liver transplant status: Secondary | ICD-10-CM

## 2016-02-18 LAB — COMPREHENSIVE METABOLIC PANEL
ALT: 15 U/L (ref 0–53)
AST: 23 U/L (ref 0–37)
Albumin: 4.4 g/dL (ref 3.5–5.2)
Alkaline Phosphatase: 89 U/L (ref 39–117)
BILIRUBIN TOTAL: 0.6 mg/dL (ref 0.2–1.2)
BUN: 25 mg/dL — AB (ref 6–23)
CALCIUM: 9.8 mg/dL (ref 8.4–10.5)
CHLORIDE: 107 meq/L (ref 96–112)
CO2: 25 meq/L (ref 19–32)
CREATININE: 2.05 mg/dL — AB (ref 0.40–1.50)
GFR: 42.5 mL/min — ABNORMAL LOW (ref >60.00)
Glucose, Bld: 102 mg/dL — ABNORMAL HIGH (ref 70–99)
Potassium: 4.2 mEq/L (ref 3.5–5.1)
SODIUM: 138 meq/L (ref 135–145)
Total Protein: 7.1 g/dL (ref 6.0–8.3)

## 2016-02-18 NOTE — Assessment & Plan Note (Signed)
Status post liver transplant and maintained on Prograf and Myfortic with no adverse side effects. Followed by Dr. Claudia Pollock of [facility]. Requesting kidney function labs. Obtain CMET. Liver function well with no signs of rejection. Continue current dosage of Prograf and Myfortic. Continue to monitor with changes in treatment per Gastroenterology.

## 2016-02-18 NOTE — Progress Notes (Signed)
Subjective:    Patient ID: Bruce Conner, male    DOB: [birth year], 61 y.o.   MRN: [id number]  Chief Complaint  Patient presents with  . Follow-up    needs blood work done, had a liver transplant and needs kidney function checked    HPI:  Bruce Conner is a 61 y.o. male who  has a past medical history of Hypertension and Liver failure (Bruce Conner). and presents today for a follow up office visit.   Liver transplant - Overall reports that he is doing well. Currently prescribed Prograf and Myfortic. Reports taking medications as prescribed and denies adverse side effects. Denies any symptoms of rejection or liver dysfunction. Requesting blood work to check kidney function per his gastroenterologist. Denies fevers or urinary symptoms.  No Known Allergies    Outpatient Medications Prior to Visit  Medication Sig Dispense Refill  . amLODipine (NORVASC) 10 MG tablet Take 10 mg by mouth daily.    Marland Kitchen aspirin EC 81 MG tablet Take 81 mg by mouth daily.    . carvedilol (COREG) 6.25 MG tablet Take 6.25 mg by mouth 2 (two) times daily with a meal.    . doxazosin (CARDURA) 8 MG tablet Take 8 mg by mouth daily.    . Magnesium 400 MG CAPS Take by mouth.    . mycophenolate (MYFORTIC) 360 MG TBEC EC tablet Take 360 mg by mouth 2 (two) times daily.    Marland Kitchen omeprazole (PRILOSEC) 20 MG capsule Take 20 mg by mouth daily.    Marland Kitchen sulfamethoxazole-trimethoprim (BACTRIM,SEPTRA) 400-80 MG tablet Take 1 tablet by mouth 2 (two) times daily.    . tacrolimus (PROGRAF) 1 MG capsule Take 1 mg by mouth 2 (two) times daily.     No facility-administered medications prior to visit.     Review of Systems  Constitutional: Negative for chills and fever.  Gastrointestinal: Negative for abdominal pain, blood in stool, constipation, diarrhea, nausea and vomiting.  Genitourinary: Negative for dysuria, flank pain, frequency, hematuria and urgency.      Objective:    BP 128/78 (BP Location: Left Arm, Patient Position:  Sitting, Cuff Size: Normal)   Pulse (!) 52   Temp 98.3 F (36.8 C) (Oral)   Resp 16   Ht 5\' 5"  (1.651 m)   Wt 160 lb (72.6 kg)   SpO2 98%   BMI 26.63 kg/m  Nursing note and vital signs reviewed.  Physical Exam  Constitutional: He is oriented to person, place, and time. He appears well-developed and well-nourished. No distress.  Cardiovascular: Normal rate, regular rhythm, normal heart sounds and intact distal pulses.   Pulmonary/Chest: Effort normal and breath sounds normal.  Abdominal: Soft. Bowel sounds are normal. He exhibits no distension and no mass. There is no tenderness. There is no rebound and no guarding.  Neurological: He is alert and oriented to person, place, and time.  Skin: Skin is warm and dry.  Psychiatric: He has a normal mood and affect. His behavior is normal. Judgment and thought content normal.       Assessment & Plan:   Problem List Items Addressed This Visit      Digestive   Status post liver transplant (Bruce Conner) - Primary    Status post liver transplant and maintained on Prograf and Myfortic with no adverse side effects. Followed by Dr. Claudia Conner of [facility]. Requesting kidney function labs. Obtain CMET. Liver function well with no signs of rejection. Continue current dosage of Prograf and Myfortic. Continue to  monitor with changes in treatment per Gastroenterology.      Relevant Orders   Comprehensive metabolic panel (Completed)       I am having Mr. Bruce Conner maintain his tacrolimus, mycophenolate, sulfamethoxazole-trimethoprim, aspirin EC, Magnesium, amLODipine, doxazosin, omeprazole, and carvedilol.   Follow-up: Return if symptoms worsen or fail to improve.  Bruce Po, FNP

## 2016-02-18 NOTE — Patient Instructions (Signed)
Thank you for choosing [facility].  SUMMARY AND INSTRUCTIONS:  Medication:  Please continue to take your medications as prescribed.   Your prescription(s) have been submitted to your pharmacy or been printed and provided for you. Please take as directed and contact our office if you believe you are having problem(s) with the medication(s) or have any questions.  Labs:  Please stop by the lab on the lower level of the building for your blood work. Your results will be released to MyChart (or called to you) after review, usually within 72 hours after test completion. If any changes need to be made, you will be notified at that same time.  1.) The lab is open from 7:30am to 5:30 pm Monday-Friday 2.) No appointment is necessary 3.) Fasting (if needed) is 6-8 hours after food and drink; black coffee and water are okay   Follow up:  If your symptoms worsen or fail to improve, please contact our office for further instruction, or in case of emergency go directly to the emergency room at the closest medical facility.     

## 2016-02-24 DIAGNOSIS — Z4823 Encounter for aftercare following liver transplant: Secondary | ICD-10-CM | POA: Diagnosis not present

## 2016-05-18 DIAGNOSIS — N183 Chronic kidney disease, stage 3 (moderate): Secondary | ICD-10-CM | POA: Diagnosis not present

## 2016-05-18 DIAGNOSIS — I10 Essential (primary) hypertension: Secondary | ICD-10-CM | POA: Diagnosis not present

## 2016-05-18 DIAGNOSIS — E559 Vitamin D deficiency, unspecified: Secondary | ICD-10-CM | POA: Diagnosis not present

## 2016-06-22 ENCOUNTER — Telehealth: Payer: Self-pay | Admitting: Family

## 2016-06-22 MED ORDER — AMLODIPINE BESYLATE 10 MG PO TABS: 10.0000 mg | ORAL_TABLET | Freq: Every day | ORAL | 0 refills | Status: DC

## 2016-06-22 NOTE — Telephone Encounter (Signed)
Inform pt sent 10 day on the amlodipine to local [facility] did not approved bactrim bcx it's an antibiotic and will need OV if need refill...Johny Chess

## 2016-06-22 NOTE — Telephone Encounter (Signed)
Patient states he has ran out of amlodipine and bactrim.  Patient is waiting on mail order to deliver.  He is requesting that 10 days worth of these medications to be sent to [facility] on [address].

## 2016-07-21 ENCOUNTER — Other Ambulatory Visit: Payer: Self-pay | Admitting: *Deleted

## 2016-07-21 MED ORDER — ASPIRIN EC 81 MG PO TBEC: 81.0000 mg | DELAYED_RELEASE_TABLET | Freq: Every day | ORAL | 3 refills | Status: AC

## 2016-07-21 NOTE — Telephone Encounter (Signed)
Rec'd call pt is wanting rx for his aspirin sent to [facility]. Verified chart pt is up-to-dated Inform will send electronically to [facility]...Bruce Conner

## 2016-08-10 ENCOUNTER — Encounter (HOSPITAL_COMMUNITY): Payer: Self-pay | Admitting: *Deleted

## 2016-08-10 ENCOUNTER — Emergency Department (HOSPITAL_COMMUNITY)
Admission: EM | Admit: 2016-08-10 | Discharge: 2016-08-10 | Disposition: A | Discharge status: Other Acute Inpatient Hospital | Payer: 59 | Attending: Emergency Medicine | Admitting: Emergency Medicine

## 2016-08-10 DIAGNOSIS — R630 Anorexia: Secondary | ICD-10-CM | POA: Insufficient documentation

## 2016-08-10 DIAGNOSIS — R5383 Other fatigue: Secondary | ICD-10-CM | POA: Diagnosis present

## 2016-08-10 DIAGNOSIS — K7689 Other specified diseases of liver: Secondary | ICD-10-CM | POA: Diagnosis not present

## 2016-08-10 DIAGNOSIS — R61 Generalized hyperhidrosis: Secondary | ICD-10-CM | POA: Diagnosis not present

## 2016-08-10 DIAGNOSIS — Z79899 Other long term (current) drug therapy: Secondary | ICD-10-CM | POA: Insufficient documentation

## 2016-08-10 DIAGNOSIS — Z7982 Long term (current) use of aspirin: Secondary | ICD-10-CM | POA: Diagnosis not present

## 2016-08-10 DIAGNOSIS — K7200 Acute and subacute hepatic failure without coma: Secondary | ICD-10-CM

## 2016-08-10 DIAGNOSIS — I10 Essential (primary) hypertension: Secondary | ICD-10-CM | POA: Insufficient documentation

## 2016-08-10 DIAGNOSIS — R197 Diarrhea, unspecified: Secondary | ICD-10-CM | POA: Diagnosis not present

## 2016-08-10 DIAGNOSIS — K838 Other specified diseases of biliary tract: Secondary | ICD-10-CM | POA: Diagnosis not present

## 2016-08-10 DIAGNOSIS — Z8619 Personal history of other infectious and parasitic diseases: Secondary | ICD-10-CM | POA: Diagnosis not present

## 2016-08-10 DIAGNOSIS — Z4823 Encounter for aftercare following liver transplant: Secondary | ICD-10-CM | POA: Diagnosis not present

## 2016-08-10 DIAGNOSIS — I129 Hypertensive chronic kidney disease with stage 1 through stage 4 chronic kidney disease, or unspecified chronic kidney disease: Secondary | ICD-10-CM | POA: Diagnosis not present

## 2016-08-10 DIAGNOSIS — D899 Disorder involving the immune mechanism, unspecified: Secondary | ICD-10-CM | POA: Diagnosis not present

## 2016-08-10 DIAGNOSIS — Z944 Liver transplant status: Secondary | ICD-10-CM | POA: Insufficient documentation

## 2016-08-10 DIAGNOSIS — Z0389 Encounter for observation for other suspected diseases and conditions ruled out: Secondary | ICD-10-CM | POA: Diagnosis not present

## 2016-08-10 DIAGNOSIS — N183 Chronic kidney disease, stage 3 (moderate): Secondary | ICD-10-CM | POA: Diagnosis not present

## 2016-08-10 DIAGNOSIS — T8641 Liver transplant rejection: Secondary | ICD-10-CM | POA: Diagnosis not present

## 2016-08-10 LAB — COMPREHENSIVE METABOLIC PANEL
ALBUMIN: 3.5 g/dL (ref 3.5–5.0)
ALK PHOS: 219 U/L — AB (ref 38–126)
ALT: 191 U/L — ABNORMAL HIGH (ref 17–63)
ANION GAP: 9 (ref 5–15)
AST: 221 U/L — ABNORMAL HIGH (ref 15–41)
BUN: 18 mg/dL (ref 6–20)
CALCIUM: 8.9 mg/dL (ref 8.9–10.3)
CHLORIDE: 102 mmol/L (ref 101–111)
CO2: 25 mmol/L (ref 22–32)
Creatinine, Ser: 1.79 mg/dL — ABNORMAL HIGH (ref 0.61–1.24)
GFR calc non Af Amer: 39 mL/min — ABNORMAL LOW (ref >60)
GFR, EST AFRICAN AMERICAN: 45 mL/min — AB (ref >60)
GLUCOSE: 108 mg/dL — AB (ref 65–99)
Potassium: 4 mmol/L (ref 3.5–5.1)
SODIUM: 136 mmol/L (ref 135–145)
Total Bilirubin: 7.9 mg/dL — ABNORMAL HIGH (ref 0.3–1.2)
Total Protein: 6.6 g/dL (ref 6.5–8.1)

## 2016-08-10 LAB — CBC
HEMATOCRIT: 30.1 % — AB (ref 39.0–52.0)
HEMOGLOBIN: 9.8 g/dL — AB (ref 13.0–17.0)
MCH: 29.2 pg (ref 26.0–34.0)
MCHC: 32.6 g/dL (ref 30.0–36.0)
MCV: 89.6 fL (ref 78.0–100.0)
Platelets: 123 10*3/uL — ABNORMAL LOW (ref 150–400)
RBC: 3.36 MIL/uL — AB (ref 4.22–5.81)
RDW: 13 % (ref 11.5–15.5)
WBC: 6.1 10*3/uL (ref 4.0–10.5)

## 2016-08-10 LAB — URINALYSIS, ROUTINE W REFLEX MICROSCOPIC
BACTERIA UA: NONE SEEN
Bilirubin Urine: NEGATIVE
Glucose, UA: NEGATIVE mg/dL
Ketones, ur: 5 mg/dL — AB
Leukocytes, UA: NEGATIVE
Nitrite: NEGATIVE
PROTEIN: 30 mg/dL — AB
SPECIFIC GRAVITY, URINE: 1.011 (ref 1.005–1.030)
SQUAMOUS EPITHELIAL / LPF: NONE SEEN
pH: 6 (ref 5.0–8.0)

## 2016-08-10 LAB — PROTIME-INR
INR: 1.26
PROTHROMBIN TIME: 15.9 s — AB (ref 11.4–15.2)

## 2016-08-10 LAB — AMMONIA: AMMONIA: 30 umol/L (ref 9–35)

## 2016-08-10 LAB — LIPASE, BLOOD: LIPASE: 29 U/L (ref 11–51)

## 2016-08-10 MED ORDER — SODIUM CHLORIDE 0.9 % IV BOLUS (SEPSIS)
1000.0000 mL | Freq: Once | INTRAVENOUS | Status: AC
  Administered 2016-08-10: 1000 mL via INTRAVENOUS

## 2016-08-10 MED ORDER — SODIUM CHLORIDE 0.9 % IV SOLN
1000.0000 mL | INTRAVENOUS | Status: DC
  Administered 2016-08-10: 1000 mL via INTRAVENOUS

## 2016-08-10 NOTE — ED Notes (Signed)
EMTALA reviewed. 

## 2016-08-10 NOTE — ED Triage Notes (Signed)
Pt states abdominal pain, diarrhea, headache, and night sweats x 4 days.  Liver transplant 2016.

## 2016-08-10 NOTE — ED Provider Notes (Signed)
[facility] DEPT Provider Note   CSN: [id number] Arrival date & time: 08/10/16  9323     History   Chief Complaint Chief Complaint  Patient presents with  . Abdominal Pain  . Night Sweats    HPI Bruce Conner is a 61 y.o. male.  HPI Patient presents to the emergency department with complaints of generalized fatigue and anorexia over the past week.  He's had ongoing diarrhea.  He is a liver transplant patient and had his liver transplant at [facility] in [place] 2 years ago.  He reports for the last month he's been out of his Prograf and Myfortic dose secondary to financial constraints.  He continues to take the Bactrim at this time.  Denies chest pain shortness of breath.  Reports no significant abdominal pain.  Reports night sweats over the past 3-4 days.  Symptoms are moderate in severity.    Past Medical History:  Diagnosis Date  . Hypertension   . Liver failure [facility])     Patient Active Problem List   Diagnosis Date Noted  . Status post liver transplant ([place]) 09/30/2015  . Liver failure ([place]) 11/02/2014  . Acute liver failure   . Hypokalemia 10/29/2014  . Hypomagnesemia 10/29/2014  . Essential hypertension 10/29/2014  . Diarrhea 10/29/2014  . Thrombocytopenia ([place]) 10/29/2014  . Elevated LFTs 10/28/2014    Past Surgical History:  Procedure Laterality Date  . ABDOMINAL SURGERY    . LIVER TRANSPLANT         Home Medications    Prior to Admission medications   Medication Sig Start Date End Date Taking? Authorizing Provider  amLODipine (NORVASC) 10 MG tablet Take 1 tablet (10 mg total) by mouth daily. 06/22/16  Yes Golden Circle, FNP  aspirin EC 81 MG tablet Take 1 tablet (81 mg total) by mouth daily. 07/21/16  Yes Golden Circle, FNP  carvedilol (COREG) 6.25 MG tablet Take 6.25 mg by mouth 2 (two) times daily with a meal.   Yes [provider]  doxazosin (CARDURA) 8 MG tablet Take 8 mg by mouth daily.   Yes [provider]  Magnesium 400 MG CAPS Take 400 mg by mouth 2 (two) times daily.    Yes [provider]  mycophenolate (MYFORTIC) 360 MG TBEC EC tablet Take 360 mg by mouth 2 (two) times daily.   Yes [provider]  omeprazole (PRILOSEC) 20 MG capsule Take 20 mg by mouth daily.   Yes [provider]  sulfamethoxazole-trimethoprim (BACTRIM,SEPTRA) 400-80 MG tablet Take 1 tablet by mouth 3 (three) times a week.    Yes [provider]  tacrolimus (PROGRAF) 1 MG capsule Take 4 mg by mouth 2 (two) times daily.    Yes [provider]    Family History Family History  Problem Relation Age of Onset  . Hypertension Brother     Social History Social History  Substance Use Topics  . Smoking status: Never Smoker  . Smokeless tobacco: Never Used  . Alcohol use No     Allergies   Patient has no known allergies.   Review of Systems Review of Systems  All other systems reviewed and are negative.    Physical Exam Updated Vital Signs BP (!) 144/70   Pulse 66   Temp 99.5 F (37.5 C) (Oral)   Resp 16   Ht 5\' 5"  (1.651 m)   Wt 70.3 kg (155 lb)   SpO2 94%   BMI 25.79 kg/m   Physical  Exam  Constitutional: He is oriented to person, place, and time. He appears well-developed and well-nourished.  HENT:  Head: Normocephalic and atraumatic.  Eyes: EOM are normal.  Neck: Normal range of motion.  Cardiovascular: Normal rate, regular rhythm and normal heart sounds.   Pulmonary/Chest: Effort normal and breath sounds normal. No respiratory distress.  Abdominal: Soft. He exhibits no distension. There is no tenderness.  Musculoskeletal: Normal range of motion.  Neurological: He is alert and oriented to person, place, and time.  Skin: Skin is warm and dry.  Psychiatric: He has a normal mood and affect. Judgment normal.  Nursing note and vitals reviewed.    ED Treatments / Results  Labs (all labs ordered are listed, but only abnormal results  are displayed) Labs Reviewed  COMPREHENSIVE METABOLIC PANEL - Abnormal; Notable for the following:       Result Value   Glucose, Bld 108 (*)    Creatinine, Ser 1.79 (*)    AST 221 (*)    ALT 191 (*)    Alkaline Phosphatase 219 (*)    Total Bilirubin 7.9 (*)    GFR calc non Af Amer 39 (*)    GFR calc Af Amer 45 (*)    All other components within normal limits  CBC - Abnormal; Notable for the following:    RBC 3.36 (*)    Hemoglobin 9.8 (*)    HCT 30.1 (*)    Platelets 123 (*)    All other components within normal limits  URINALYSIS, ROUTINE W REFLEX MICROSCOPIC - Abnormal; Notable for the following:    Color, Urine AMBER (*)    Hgb urine dipstick SMALL (*)    Ketones, ur 5 (*)    Protein, ur 30 (*)    All other components within normal limits  PROTIME-INR - Abnormal; Notable for the following:    Prothrombin Time 15.9 (*)    All other components within normal limits  LIPASE, BLOOD  AMMONIA   ALT  Date Value Ref Range Status  08/10/2016 191 (H) 17 - 63 U/L Final  02/18/2016 15 0 - 53 U/L Final  11/02/2014 893 (H) 17 - 63 U/L Final  11/01/2014 998 (H) 17 - 63 U/L Final    AST  Date Value Ref Range Status  08/10/2016 221 (H) 15 - 41 U/L Final  02/18/2016 23 0 - 37 U/L Final  11/02/2014 756 (H) 15 - 41 U/L Final  11/01/2014 934 (H) 15 - 41 U/L Final   BUN  Date Value Ref Range Status  08/10/2016 18 6 - 20 mg/dL Final  02/18/2016 25 (H) 6 - 23 mg/dL Final  11/02/2014 5 (L) 6 - 20 mg/dL Final  11/01/2014 <5 (L) 6 - 20 mg/dL Final   Creatinine, Ser  Date Value Ref Range Status  08/10/2016 1.79 (H) 0.61 - 1.24 mg/dL Final  02/18/2016 2.05 (H) 0.40 - 1.50 mg/dL Final  11/02/2014 <0.30 (L) 0.61 - 1.24 mg/dL Final  11/01/2014 <0.30 (L) 0.61 - 1.24 mg/dL Final     EKG  EKG Interpretation None       Radiology No results found.  Procedures Procedures (including critical care time)  Medications Ordered in ED Medications  sodium chloride 0.9 % bolus 1,000  mL (0 mLs Intravenous Stopped 08/10/16 1103)    Followed by  0.9 %  sodium chloride infusion (1,000 mLs Intravenous New Bag/Given 08/10/16 1112)     Initial Impression / Assessment and Plan / ED Course  I have reviewed the  triage vital signs and the nursing notes.  Pertinent labs & imaging results that were available during my care of the patient were reviewed by me and considered in my medical decision making (see chart for details).     Patient hydrated in the emergency department.  His liver function tests are concerning for liver failure in the setting of liver transplant and noncompliance with antirejection medications.  I spoke with the hepatology team at [facility] who accepts the patient in transfer.  Case was discussed with Dr. Donneta Romberg.  He requested the patient be admitted to the hospitalist service at [facility].  I discussed the case with the hospitalist Dr. Valinda Party.  Patient family updated.  Final Clinical Impressions(s) / ED Diagnoses   Final diagnoses:  Acute liver failure without hepatic coma  Transplanted liver [facility])    New Prescriptions New Prescriptions   No medications on file     Jola Schmidt, MD 08/10/16 [phone]

## 2016-08-10 NOTE — ED Notes (Signed)
[facility] patient transport around 7pm RN called [facility] for transport- informed staff will call back soon with time estimate.

## 2016-08-10 NOTE — ED Notes (Signed)
Walked patient to the bathroom patient did well got patient sample urine

## 2016-08-10 NOTE — ED Notes (Signed)
WALKED PATIENT TO THE BATHROOM PATIENT BACK IN BED

## 2016-08-26 DIAGNOSIS — Z4823 Encounter for aftercare following liver transplant: Secondary | ICD-10-CM | POA: Diagnosis not present

## 2016-08-26 DIAGNOSIS — D899 Disorder involving the immune mechanism, unspecified: Secondary | ICD-10-CM | POA: Diagnosis not present

## 2016-08-26 DIAGNOSIS — T8641 Liver transplant rejection: Secondary | ICD-10-CM | POA: Diagnosis not present

## 2016-08-26 DIAGNOSIS — Z79899 Other long term (current) drug therapy: Secondary | ICD-10-CM | POA: Diagnosis not present

## 2016-08-26 DIAGNOSIS — Z944 Liver transplant status: Secondary | ICD-10-CM | POA: Diagnosis not present

## 2016-09-03 DIAGNOSIS — B259 Cytomegaloviral disease, unspecified: Secondary | ICD-10-CM | POA: Diagnosis not present

## 2016-09-03 DIAGNOSIS — Z944 Liver transplant status: Secondary | ICD-10-CM | POA: Diagnosis not present

## 2016-09-03 DIAGNOSIS — Z48298 Encounter for aftercare following other organ transplant: Secondary | ICD-10-CM | POA: Diagnosis not present

## 2016-09-10 DIAGNOSIS — Z944 Liver transplant status: Secondary | ICD-10-CM | POA: Diagnosis not present

## 2016-09-10 DIAGNOSIS — Z48298 Encounter for aftercare following other organ transplant: Secondary | ICD-10-CM | POA: Diagnosis not present

## 2016-09-10 DIAGNOSIS — B259 Cytomegaloviral disease, unspecified: Secondary | ICD-10-CM | POA: Diagnosis not present

## 2016-09-17 DIAGNOSIS — B259 Cytomegaloviral disease, unspecified: Secondary | ICD-10-CM | POA: Diagnosis not present

## 2016-09-17 DIAGNOSIS — Z944 Liver transplant status: Secondary | ICD-10-CM | POA: Diagnosis not present

## 2016-09-17 DIAGNOSIS — Z48298 Encounter for aftercare following other organ transplant: Secondary | ICD-10-CM | POA: Diagnosis not present

## 2016-09-24 DIAGNOSIS — Z944 Liver transplant status: Secondary | ICD-10-CM | POA: Diagnosis not present

## 2016-09-24 DIAGNOSIS — B259 Cytomegaloviral disease, unspecified: Secondary | ICD-10-CM | POA: Diagnosis not present

## 2016-09-24 DIAGNOSIS — Z48298 Encounter for aftercare following other organ transplant: Secondary | ICD-10-CM | POA: Diagnosis not present

## 2016-09-29 DIAGNOSIS — Z4823 Encounter for aftercare following liver transplant: Secondary | ICD-10-CM | POA: Diagnosis not present

## 2016-09-29 DIAGNOSIS — Z79899 Other long term (current) drug therapy: Secondary | ICD-10-CM | POA: Diagnosis not present

## 2016-09-29 DIAGNOSIS — Z944 Liver transplant status: Secondary | ICD-10-CM | POA: Diagnosis not present

## 2016-09-29 DIAGNOSIS — T8641 Liver transplant rejection: Secondary | ICD-10-CM | POA: Diagnosis not present

## 2016-10-08 DIAGNOSIS — B259 Cytomegaloviral disease, unspecified: Secondary | ICD-10-CM | POA: Diagnosis not present

## 2016-10-08 DIAGNOSIS — Z944 Liver transplant status: Secondary | ICD-10-CM | POA: Diagnosis not present

## 2016-10-08 DIAGNOSIS — T8641 Liver transplant rejection: Secondary | ICD-10-CM | POA: Diagnosis not present

## 2016-10-08 DIAGNOSIS — Z48298 Encounter for aftercare following other organ transplant: Secondary | ICD-10-CM | POA: Diagnosis not present

## 2016-10-09 DIAGNOSIS — Z944 Liver transplant status: Secondary | ICD-10-CM | POA: Diagnosis not present

## 2016-10-09 DIAGNOSIS — N183 Chronic kidney disease, stage 3 (moderate): Secondary | ICD-10-CM | POA: Diagnosis not present

## 2016-10-09 DIAGNOSIS — N179 Acute kidney failure, unspecified: Secondary | ICD-10-CM | POA: Diagnosis not present

## 2016-10-15 DIAGNOSIS — Z944 Liver transplant status: Secondary | ICD-10-CM | POA: Diagnosis not present

## 2016-10-15 DIAGNOSIS — B259 Cytomegaloviral disease, unspecified: Secondary | ICD-10-CM | POA: Diagnosis not present

## 2016-10-15 DIAGNOSIS — Z48298 Encounter for aftercare following other organ transplant: Secondary | ICD-10-CM | POA: Diagnosis not present

## 2016-10-22 DIAGNOSIS — Z48298 Encounter for aftercare following other organ transplant: Secondary | ICD-10-CM | POA: Diagnosis not present

## 2016-10-22 DIAGNOSIS — Z944 Liver transplant status: Secondary | ICD-10-CM | POA: Diagnosis not present

## 2016-10-22 DIAGNOSIS — B259 Cytomegaloviral disease, unspecified: Secondary | ICD-10-CM | POA: Diagnosis not present

## 2016-10-27 ENCOUNTER — Ambulatory Visit (INDEPENDENT_AMBULATORY_CARE_PROVIDER_SITE_OTHER): Payer: 59 | Admitting: Family

## 2016-10-27 ENCOUNTER — Encounter: Payer: Self-pay | Admitting: Family

## 2016-10-27 VITALS — BP 132/80 | HR 83 | Temp 98.1°F | Resp 16 | Ht 65.0 in | Wt 166.5 lb

## 2016-10-27 DIAGNOSIS — R011 Cardiac murmur, unspecified: Secondary | ICD-10-CM | POA: Insufficient documentation

## 2016-10-27 DIAGNOSIS — I10 Essential (primary) hypertension: Secondary | ICD-10-CM | POA: Diagnosis not present

## 2016-10-27 MED ORDER — DOXAZOSIN MESYLATE 8 MG PO TABS: 8.0000 mg | ORAL_TABLET | Freq: Every day | ORAL | 0 refills | Status: DC

## 2016-10-27 NOTE — Patient Instructions (Addendum)
Thank you for choosing [facility].  SUMMARY AND INSTRUCTIONS:  You will receive a call to schedule your ultrasound.  Continue to take your medications as prescribed.   Monitor your blood pressure at home and follow a low sodium diet.    Follow up:  If your symptoms worsen or fail to improve, please contact our office for further instruction, or in case of emergency go directly to the emergency room at the closest medical facility.

## 2016-10-27 NOTE — Assessment & Plan Note (Signed)
Systolic murmur noted in the aortic region with concern for aortic stenosis. Obtain 2D echo. Continue to monitor pending echocardiogram results.Marland Kitchen

## 2016-10-27 NOTE — Assessment & Plan Note (Signed)
Blood pressure well controlled with current medication regimen and no adverse side effects. Denies worst headache of life. Continue current dosage of amlodipine and doxazosin. Monitor blood pressure at home daily and follow a low sodium diet.

## 2016-10-27 NOTE — Progress Notes (Signed)
Subjective:    Patient ID: Bruce Conner, male    DOB: April 02, 1955, 61 y.o.   MRN: [id number]  Chief Complaint  Patient presents with  . Follow-up    Hypertension, GERD    HPI:  Bruce Conner is a 61 y.o. male who  has a past medical history of Hypertension and Liver failure ([place]). and presents today for a follow up office visit.   1.) Hypertension - Currently prescribed doxazosin and amlodipine. Reports taking the medications as prescribed and denies adverse side effects or hypotensive readings. Blood pressures at home has been well controlled.  Denies changes in vision, worst headache of life or new symptoms of end organ damage. Working on following a low sodium diet.   BP Readings from Last 3 Encounters:  10/27/16 132/80  08/10/16 (!) 141/103  02/18/16 128/78   2.) GERD - Currently maintained on omeprazole. Reports taking the medications with no adverse side effects. Symptoms are generally well controlled with the current medication.   No Known Allergies    Outpatient Medications Prior to Visit  Medication Sig Dispense Refill  . amLODipine (NORVASC) 10 MG tablet Take 1 tablet (10 mg total) by mouth daily. 10 tablet 0  . aspirin EC 81 MG tablet Take 1 tablet (81 mg total) by mouth daily. 90 tablet 3  . mycophenolate (MYFORTIC) 360 MG TBEC EC tablet Take 360 mg by mouth 2 (two) times daily. Take 2 tablets (720 mg) 2 times daily    . omeprazole (PRILOSEC) 20 MG capsule Take 20 mg by mouth daily.    Marland Kitchen sulfamethoxazole-trimethoprim (BACTRIM,SEPTRA) 400-80 MG tablet Take 1 tablet by mouth 3 (three) times a week.     . tacrolimus (PROGRAF) 1 MG capsule Take 4 mg by mouth 2 (two) times daily. Take 6 capsules in the morning and 6 capsules in the evening    . doxazosin (CARDURA) 8 MG tablet Take 8 mg by mouth daily.    . carvedilol (COREG) 6.25 MG tablet Take 6.25 mg by mouth 2 (two) times daily with a meal.    . Magnesium 400 MG CAPS Take 400 mg by mouth 2 (two) times daily.        No facility-administered medications prior to visit.     Past Medical History:  Diagnosis Date  . Hypertension   . Liver failure ([place])     Review of Systems  Constitutional: Negative for chills and fever.  Eyes:       Negative for changes in vision  Respiratory: Negative for cough, chest tightness and wheezing.   Cardiovascular: Negative for chest pain, palpitations and leg swelling.  Neurological: Negative for dizziness, weakness and light-headedness.      Objective:    BP 132/80 (BP Location: Left Arm, Patient Position: Sitting, Cuff Size: Large)   Pulse 83   Temp 98.1 F (36.7 C) (Oral)   Resp 16   Ht 5\' 5"  (1.651 m)   Wt 166 lb 8 oz (75.5 kg)   SpO2 98%   BMI 27.71 kg/m  Nursing note and vital signs reviewed.  Physical Exam  Constitutional: He is oriented to person, place, and time. He appears well-developed and well-nourished. No distress.  Cardiovascular: Normal rate, regular rhythm and intact distal pulses.   Murmur heard.  Systolic murmur is present with a grade of 2/6  Pulmonary/Chest: Effort normal and breath sounds normal.  Abdominal: Soft. Bowel sounds are normal. He exhibits no distension and no mass. There is no tenderness. There is  no rebound and no guarding.  Neurological: He is alert and oriented to person, place, and time.  Skin: Skin is warm and dry.  Psychiatric: He has a normal mood and affect. His behavior is normal. Judgment and thought content normal.       Assessment & Plan:   Problem List Items Addressed This Visit      Cardiovascular and Mediastinum   Essential hypertension - Primary    Blood pressure well controlled with current medication regimen and no adverse side effects. Denies worst headache of life. Continue current dosage of amlodipine and doxazosin. Monitor blood pressure at home daily and follow a low sodium diet.       Relevant Medications   doxazosin (CARDURA) 8 MG tablet     Other   Heart murmur    Systolic murmur  noted in the aortic region with concern for aortic stenosis. Obtain 2D echo. Continue to monitor pending echocardiogram results..       Relevant Orders   ECHOCARDIOGRAM COMPLETE       I have discontinued Mr. Slocumb Magnesium and carvedilol. I have also changed his doxazosin. Additionally, I am having him maintain his tacrolimus, mycophenolate, sulfamethoxazole-trimethoprim, omeprazole, amLODipine, aspirin EC, predniSONE, and valGANciclovir.   Meds ordered this encounter  Medications  . predniSONE (DELTASONE) 5 MG tablet    Sig: Take 5 mg by mouth 2 (two) times daily.  . valGANciclovir (VALCYTE) 450 MG tablet    Sig: Take by mouth daily.  Marland Kitchen doxazosin (CARDURA) 8 MG tablet    Sig: Take 1 tablet (8 mg total) by mouth daily.    Dispense:  90 tablet    Refill:  0    Order Specific Question:   Supervising Provider    Answer:   Pricilla Holm A [[id number]]     Follow-up: Return in about 6 months (around 04/27/2017), or if symptoms worsen or fail to improve.  Mauricio Po, FNP

## 2016-10-29 DIAGNOSIS — B259 Cytomegaloviral disease, unspecified: Secondary | ICD-10-CM | POA: Diagnosis not present

## 2016-10-29 DIAGNOSIS — Z944 Liver transplant status: Secondary | ICD-10-CM | POA: Diagnosis not present

## 2016-10-29 DIAGNOSIS — Z48298 Encounter for aftercare following other organ transplant: Secondary | ICD-10-CM | POA: Diagnosis not present

## 2016-11-04 ENCOUNTER — Other Ambulatory Visit (HOSPITAL_COMMUNITY): Payer: 59

## 2016-11-04 DIAGNOSIS — Z8619 Personal history of other infectious and parasitic diseases: Secondary | ICD-10-CM | POA: Diagnosis not present

## 2016-11-04 DIAGNOSIS — D899 Disorder involving the immune mechanism, unspecified: Secondary | ICD-10-CM | POA: Diagnosis not present

## 2016-11-04 DIAGNOSIS — Z4823 Encounter for aftercare following liver transplant: Secondary | ICD-10-CM | POA: Diagnosis not present

## 2016-11-09 ENCOUNTER — Ambulatory Visit (HOSPITAL_COMMUNITY): Payer: 59 | Attending: Cardiology

## 2016-11-09 ENCOUNTER — Other Ambulatory Visit: Payer: Self-pay

## 2016-11-09 DIAGNOSIS — I071 Rheumatic tricuspid insufficiency: Secondary | ICD-10-CM | POA: Insufficient documentation

## 2016-11-09 DIAGNOSIS — R011 Cardiac murmur, unspecified: Secondary | ICD-10-CM | POA: Diagnosis not present

## 2016-11-09 DIAGNOSIS — I119 Hypertensive heart disease without heart failure: Secondary | ICD-10-CM | POA: Insufficient documentation

## 2016-11-12 DIAGNOSIS — Z944 Liver transplant status: Secondary | ICD-10-CM | POA: Diagnosis not present

## 2016-11-12 DIAGNOSIS — B259 Cytomegaloviral disease, unspecified: Secondary | ICD-10-CM | POA: Diagnosis not present

## 2016-11-12 DIAGNOSIS — Z48298 Encounter for aftercare following other organ transplant: Secondary | ICD-10-CM | POA: Diagnosis not present

## 2016-11-17 ENCOUNTER — Ambulatory Visit (INDEPENDENT_AMBULATORY_CARE_PROVIDER_SITE_OTHER): Payer: 59 | Admitting: General Practice

## 2016-11-17 DIAGNOSIS — Z23 Encounter for immunization: Secondary | ICD-10-CM

## 2016-11-19 DIAGNOSIS — B259 Cytomegaloviral disease, unspecified: Secondary | ICD-10-CM | POA: Diagnosis not present

## 2016-11-19 DIAGNOSIS — Z48298 Encounter for aftercare following other organ transplant: Secondary | ICD-10-CM | POA: Diagnosis not present

## 2016-11-19 DIAGNOSIS — Z944 Liver transplant status: Secondary | ICD-10-CM | POA: Diagnosis not present

## 2016-11-23 DIAGNOSIS — N179 Acute kidney failure, unspecified: Secondary | ICD-10-CM | POA: Diagnosis not present

## 2016-11-23 DIAGNOSIS — N183 Chronic kidney disease, stage 3 (moderate): Secondary | ICD-10-CM | POA: Diagnosis not present

## 2016-11-26 DIAGNOSIS — Z48298 Encounter for aftercare following other organ transplant: Secondary | ICD-10-CM | POA: Diagnosis not present

## 2016-11-26 DIAGNOSIS — B259 Cytomegaloviral disease, unspecified: Secondary | ICD-10-CM | POA: Diagnosis not present

## 2016-11-26 DIAGNOSIS — Z944 Liver transplant status: Secondary | ICD-10-CM | POA: Diagnosis not present

## 2016-12-02 DIAGNOSIS — Z4823 Encounter for aftercare following liver transplant: Secondary | ICD-10-CM | POA: Diagnosis not present

## 2016-12-02 DIAGNOSIS — D899 Disorder involving the immune mechanism, unspecified: Secondary | ICD-10-CM | POA: Diagnosis not present

## 2016-12-02 DIAGNOSIS — B259 Cytomegaloviral disease, unspecified: Secondary | ICD-10-CM | POA: Diagnosis not present

## 2016-12-03 DIAGNOSIS — Z944 Liver transplant status: Secondary | ICD-10-CM | POA: Diagnosis not present

## 2016-12-03 DIAGNOSIS — Z48298 Encounter for aftercare following other organ transplant: Secondary | ICD-10-CM | POA: Diagnosis not present

## 2016-12-03 DIAGNOSIS — B259 Cytomegaloviral disease, unspecified: Secondary | ICD-10-CM | POA: Diagnosis not present

## 2016-12-17 DIAGNOSIS — Z48298 Encounter for aftercare following other organ transplant: Secondary | ICD-10-CM | POA: Diagnosis not present

## 2016-12-17 DIAGNOSIS — B259 Cytomegaloviral disease, unspecified: Secondary | ICD-10-CM | POA: Diagnosis not present

## 2016-12-17 DIAGNOSIS — Z944 Liver transplant status: Secondary | ICD-10-CM | POA: Diagnosis not present

## 2017-01-14 DIAGNOSIS — B259 Cytomegaloviral disease, unspecified: Secondary | ICD-10-CM | POA: Diagnosis not present

## 2017-01-14 DIAGNOSIS — Z48298 Encounter for aftercare following other organ transplant: Secondary | ICD-10-CM | POA: Diagnosis not present

## 2017-01-14 DIAGNOSIS — Z944 Liver transplant status: Secondary | ICD-10-CM | POA: Diagnosis not present

## 2017-02-05 ENCOUNTER — Ambulatory Visit (INDEPENDENT_AMBULATORY_CARE_PROVIDER_SITE_OTHER): Payer: 59 | Admitting: Family Medicine

## 2017-02-05 ENCOUNTER — Encounter: Payer: Self-pay | Admitting: Family Medicine

## 2017-02-05 DIAGNOSIS — I10 Essential (primary) hypertension: Secondary | ICD-10-CM | POA: Diagnosis not present

## 2017-02-05 DIAGNOSIS — Z944 Liver transplant status: Secondary | ICD-10-CM | POA: Diagnosis not present

## 2017-02-05 DIAGNOSIS — Z48298 Encounter for aftercare following other organ transplant: Secondary | ICD-10-CM | POA: Diagnosis not present

## 2017-02-05 DIAGNOSIS — B259 Cytomegaloviral disease, unspecified: Secondary | ICD-10-CM | POA: Diagnosis not present

## 2017-02-05 NOTE — Progress Notes (Signed)
    Subjective:  Bruce Conner is a 62 y.o. male who presents today with a chief complaint of status post liver transplant and hypertension.   HPI:  S/p liver transplant, established problem Patient underwent liver transplant approximately 2 years ago in [facility].  Was unclear etiology of his liver failure.  He sees them twice yearly.  States that he was told by them that he needs to establish a primary care physician in [place] that would be able to fill out his disability paperwork.  Patient reports that he has been disabled since his liver transplant 2 years ago.  Request that we fill out paperwork today.  Overall, his symptoms are stable.  He now sees a Engineer, civil (consulting) in [place].  Hypertension, established problem, Stable BP Readings from Last 3 Encounters:  02/05/17 140/82  10/27/16 132/80  08/10/16 (!) 141/103  Currently on amlodipine 10 mg daily.  Tolerates this dose well without side effects. ROS: Denies any chest pain, shortness of breath, dyspnea on exertion, leg edema.   Objective:  Physical Exam: BP 140/82 (BP Location: Left Arm, Patient Position: Sitting, Cuff Size: Normal)   Pulse 94   Temp 98.6 F (37 C) (Oral)   Ht 5\' 5"  (1.651 m)   Wt 174 lb 3.2 oz (79 kg)   SpO2 97%   BMI 28.99 kg/m   Gen: NAD, resting comfortably CV: RRR with no murmurs appreciated Pulm: NWOB, CTAB with no crackles, wheezes, or rhonchi GI: Normal bowel sounds present. Soft, Nontender, Nondistended.  Assessment/Plan:  Essential hypertension Borderline today.  Continue amlodipine 10 mg daily.  Patient reports that pressures are usually in the 120s over 80s at home.  Continued home blood pressure monitoring with goal 140/90 or lower.  Also discussed lifestyle modifications including low-salt diet.  He will follow-up with me in 6-12 months.  Status post liver transplant [facility]) Patient has discussed to take over disability paperwork.  Advised him that we would be unable to do  this until he obtain records from his hematologist and until he see what specifically his disability form that score.  We will obtain records from from his hematologist in [place].  He will drop off the disability paperwork next week.  Medically, he seems to be doing well status post his liver transplant.  Defer further management to pathology.  Preventative healthcare Advised patient to return within the next 6-12 months for CPE.  Algis Greenhouse. [name], MD 02/05/2017 12:21 PM

## 2017-02-05 NOTE — Assessment & Plan Note (Signed)
Borderline today.  Continue amlodipine 10 mg daily.  Patient reports that pressures are usually in the 120s over 80s at home.  Continued home blood pressure monitoring with goal 140/90 or lower.  Also discussed lifestyle modifications including low-salt diet.  He will follow-up with me in 6-12 months.

## 2017-02-05 NOTE — Assessment & Plan Note (Signed)
Patient has discussed to take over disability paperwork.  Advised him that we would be unable to do this until he obtain records from his hematologist and until he see what specifically his disability form that score.  We will obtain records from from his hematologist in [place].  He will drop off the disability paperwork next week.  Medically, he seems to be doing well status post his liver transplant.  Defer further management to pathology.

## 2017-02-05 NOTE — Patient Instructions (Signed)
We need to get records from your kidney doctors in [place].  You can bring your paperwork by the office and we can see what we can do. It depends on what the form asks for.  No medication changes today.  Please keep a close eye on your blood pressure. Your goal blood pressure should be 140/90 or lower. Please avoid salt.  Come back to see me in 6-12 months for a full physical.  Take care, Dr [name]

## 2017-02-09 ENCOUNTER — Telehealth: Payer: Self-pay | Admitting: Family Medicine

## 2017-02-09 NOTE — Telephone Encounter (Signed)
Patient dropped off form to extend disability 02/09/17. Form Is in Dr. Marigene Ehlers pick up folder.

## 2017-02-10 NOTE — Telephone Encounter (Signed)
I have those forms on my desk, but they have already been filled out by another physician, expiring on 02/20/2017.  Unsure exactly what patient is asking [place] to do.  They are not disability forms, they are FMLA forms.  If patient is wanting disability, he will have to get an attorney for that.  If he wants an extension on his FMLA, he will have to get the proper forms from his employer and provide [place] with with forms.    LM for patient to return call.  CRM created.

## 2017-02-11 DIAGNOSIS — Z48298 Encounter for aftercare following other organ transplant: Secondary | ICD-10-CM | POA: Diagnosis not present

## 2017-02-11 DIAGNOSIS — B259 Cytomegaloviral disease, unspecified: Secondary | ICD-10-CM | POA: Diagnosis not present

## 2017-02-11 DIAGNOSIS — Z944 Liver transplant status: Secondary | ICD-10-CM | POA: Diagnosis not present

## 2017-02-12 NOTE — Telephone Encounter (Signed)
I have requested blank forms from patient's place of work.

## 2017-02-17 NOTE — Telephone Encounter (Signed)
Forms have been received

## 2017-02-18 DIAGNOSIS — Z48298 Encounter for aftercare following other organ transplant: Secondary | ICD-10-CM | POA: Diagnosis not present

## 2017-02-18 DIAGNOSIS — B259 Cytomegaloviral disease, unspecified: Secondary | ICD-10-CM | POA: Diagnosis not present

## 2017-02-18 DIAGNOSIS — Z944 Liver transplant status: Secondary | ICD-10-CM | POA: Diagnosis not present

## 2017-02-22 NOTE — Telephone Encounter (Signed)
Forms were unclear (unreadable).  I have requested patient's employer refax forms to our office.  Will fill out as soon as they are received.

## 2017-02-25 ENCOUNTER — Telehealth: Payer: Self-pay | Admitting: Family Medicine

## 2017-02-25 DIAGNOSIS — Z48298 Encounter for aftercare following other organ transplant: Secondary | ICD-10-CM | POA: Diagnosis not present

## 2017-02-25 DIAGNOSIS — B259 Cytomegaloviral disease, unspecified: Secondary | ICD-10-CM | POA: Diagnosis not present

## 2017-02-25 DIAGNOSIS — Z944 Liver transplant status: Secondary | ICD-10-CM | POA: Diagnosis not present

## 2017-02-25 NOTE — Telephone Encounter (Signed)
Please advise.  Ok for patient to go back to work on Monday?

## 2017-02-25 NOTE — Telephone Encounter (Signed)
Patient has decided to return to work.  Please see other message.

## 2017-02-25 NOTE — Telephone Encounter (Signed)
[place] with me.   Algis Greenhouse. [name], MD 02/25/2017 11:35 AM

## 2017-02-25 NOTE — Telephone Encounter (Signed)
Copied from [place]. Topic: General - Other >> Feb 25, 2017  8:52 AM Yvette Rack wrote: Reason for CRM: pt has sign up for long term disability around 02-14-17 and would like to return back to work pt is feeling better he has to get back to work due to medication cost to much and  bills have to get paid he doesn't want to wait for the disability to go through call pt when he can get him back in to work he wants to go back on Monday his provider is Dr [name]

## 2017-02-25 NOTE — Telephone Encounter (Signed)
Return to work note written and signed.  Patient notified and will come and pick up.

## 2017-03-03 DIAGNOSIS — B258 Other cytomegaloviral diseases: Secondary | ICD-10-CM | POA: Diagnosis not present

## 2017-03-03 DIAGNOSIS — D899 Disorder involving the immune mechanism, unspecified: Secondary | ICD-10-CM | POA: Diagnosis not present

## 2017-03-03 DIAGNOSIS — Z4823 Encounter for aftercare following liver transplant: Secondary | ICD-10-CM | POA: Diagnosis not present

## 2017-03-04 DIAGNOSIS — Z4823 Encounter for aftercare following liver transplant: Secondary | ICD-10-CM | POA: Diagnosis not present

## 2017-03-04 DIAGNOSIS — D899 Disorder involving the immune mechanism, unspecified: Secondary | ICD-10-CM | POA: Diagnosis not present

## 2017-03-04 DIAGNOSIS — B259 Cytomegaloviral disease, unspecified: Secondary | ICD-10-CM | POA: Diagnosis not present

## 2017-03-18 DIAGNOSIS — Z4823 Encounter for aftercare following liver transplant: Secondary | ICD-10-CM | POA: Diagnosis not present

## 2017-03-18 DIAGNOSIS — D899 Disorder involving the immune mechanism, unspecified: Secondary | ICD-10-CM | POA: Diagnosis not present

## 2017-03-18 DIAGNOSIS — B259 Cytomegaloviral disease, unspecified: Secondary | ICD-10-CM | POA: Diagnosis not present

## 2017-04-01 DIAGNOSIS — Z4823 Encounter for aftercare following liver transplant: Secondary | ICD-10-CM | POA: Diagnosis not present

## 2017-04-01 DIAGNOSIS — D899 Disorder involving the immune mechanism, unspecified: Secondary | ICD-10-CM | POA: Diagnosis not present

## 2017-04-01 DIAGNOSIS — B259 Cytomegaloviral disease, unspecified: Secondary | ICD-10-CM | POA: Diagnosis not present

## 2017-04-15 DIAGNOSIS — D899 Disorder involving the immune mechanism, unspecified: Secondary | ICD-10-CM | POA: Diagnosis not present

## 2017-04-15 DIAGNOSIS — Z4823 Encounter for aftercare following liver transplant: Secondary | ICD-10-CM | POA: Diagnosis not present

## 2017-04-15 DIAGNOSIS — B259 Cytomegaloviral disease, unspecified: Secondary | ICD-10-CM | POA: Diagnosis not present

## 2017-05-27 DIAGNOSIS — Z4823 Encounter for aftercare following liver transplant: Secondary | ICD-10-CM | POA: Diagnosis not present

## 2017-05-27 DIAGNOSIS — D899 Disorder involving the immune mechanism, unspecified: Secondary | ICD-10-CM | POA: Diagnosis not present

## 2017-05-27 DIAGNOSIS — B259 Cytomegaloviral disease, unspecified: Secondary | ICD-10-CM | POA: Diagnosis not present

## 2017-06-02 DIAGNOSIS — Z8619 Personal history of other infectious and parasitic diseases: Secondary | ICD-10-CM | POA: Diagnosis not present

## 2017-06-02 DIAGNOSIS — D899 Disorder involving the immune mechanism, unspecified: Secondary | ICD-10-CM | POA: Diagnosis not present

## 2017-06-02 DIAGNOSIS — Z944 Liver transplant status: Secondary | ICD-10-CM | POA: Diagnosis not present

## 2017-06-03 ENCOUNTER — Other Ambulatory Visit: Payer: Self-pay | Admitting: Nurse Practitioner

## 2017-06-03 DIAGNOSIS — Z944 Liver transplant status: Secondary | ICD-10-CM

## 2017-06-22 ENCOUNTER — Ambulatory Visit (INDEPENDENT_AMBULATORY_CARE_PROVIDER_SITE_OTHER): Payer: 59 | Admitting: Family Medicine

## 2017-06-22 ENCOUNTER — Encounter: Payer: Self-pay | Admitting: Family Medicine

## 2017-06-22 VITALS — BP 132/84 | HR 91 | Temp 97.6°F | Ht 65.0 in | Wt 168.8 lb

## 2017-06-22 DIAGNOSIS — I10 Essential (primary) hypertension: Secondary | ICD-10-CM | POA: Diagnosis not present

## 2017-06-22 DIAGNOSIS — R739 Hyperglycemia, unspecified: Secondary | ICD-10-CM

## 2017-06-22 DIAGNOSIS — Z944 Liver transplant status: Secondary | ICD-10-CM

## 2017-06-22 DIAGNOSIS — Z13220 Encounter for screening for lipoid disorders: Secondary | ICD-10-CM

## 2017-06-22 DIAGNOSIS — D696 Thrombocytopenia, unspecified: Secondary | ICD-10-CM

## 2017-06-22 DIAGNOSIS — Z125 Encounter for screening for malignant neoplasm of prostate: Secondary | ICD-10-CM | POA: Diagnosis not present

## 2017-06-22 DIAGNOSIS — Z9189 Other specified personal risk factors, not elsewhere classified: Secondary | ICD-10-CM

## 2017-06-22 DIAGNOSIS — Z0001 Encounter for general adult medical examination with abnormal findings: Secondary | ICD-10-CM | POA: Diagnosis not present

## 2017-06-22 LAB — COMPREHENSIVE METABOLIC PANEL
ALBUMIN: 4.7 g/dL (ref 3.5–5.2)
ALK PHOS: 91 U/L (ref 39–117)
ALT: 15 U/L (ref 0–53)
AST: 25 U/L (ref 0–37)
BUN: 18 mg/dL (ref 6–23)
CALCIUM: 10 mg/dL (ref 8.4–10.5)
CHLORIDE: 107 meq/L (ref 96–112)
CO2: 26 mEq/L (ref 19–32)
CREATININE: 1.6 mg/dL — AB (ref 0.40–1.50)
GFR: 56.32 mL/min — ABNORMAL LOW (ref >60.00)
Glucose, Bld: 110 mg/dL — ABNORMAL HIGH (ref 70–99)
Potassium: 3.9 mEq/L (ref 3.5–5.1)
Sodium: 142 mEq/L (ref 135–145)
TOTAL PROTEIN: 6.8 g/dL (ref 6.0–8.3)
Total Bilirubin: 0.6 mg/dL (ref 0.2–1.2)

## 2017-06-22 LAB — CBC
HEMATOCRIT: 39.3 % (ref 39.0–52.0)
Hemoglobin: 13.1 g/dL (ref 13.0–17.0)
MCHC: 33.3 g/dL (ref 30.0–36.0)
MCV: 87.9 fl (ref 78.0–100.0)
PLATELETS: 131 10*3/uL — AB (ref 150.0–400.0)
RBC: 4.47 Mil/uL (ref 4.22–5.81)
RDW: 13.9 % (ref 11.5–15.5)
WBC: 3.4 10*3/uL — ABNORMAL LOW (ref 4.0–10.5)

## 2017-06-22 LAB — LIPID PANEL
CHOLESTEROL: 208 mg/dL — AB (ref 0–200)
HDL: 37.8 mg/dL — ABNORMAL LOW (ref >39.00)
LDL Cholesterol: 146 mg/dL — ABNORMAL HIGH (ref 0–99)
NonHDL: 169.91
Total CHOL/HDL Ratio: 5
Triglycerides: 118 mg/dL (ref 0.0–149.0)
VLDL: 23.6 mg/dL (ref 0.0–40.0)

## 2017-06-22 LAB — PSA: PSA: 0.69 ng/mL (ref 0.10–4.00)

## 2017-06-22 LAB — TSH: TSH: 2.02 u[IU]/mL (ref 0.35–4.50)

## 2017-06-22 LAB — HEMOGLOBIN A1C: HEMOGLOBIN A1C: 5.2 % (ref 4.6–6.5)

## 2017-06-22 NOTE — Assessment & Plan Note (Addendum)
At goal.  Continue amlodipine 10 mg daily and Cardura 8 mg daily.  Check CBC, CMET, and TSH.

## 2017-06-22 NOTE — Patient Instructions (Signed)
It was nice to see you today!  We will check blood work.  No med changes today. Come back to see me in 1 year for your next physical, or sooner as needed.   Take care, Dr [name]   Preventive Care 40-64 Years, Male Preventive care refers to lifestyle choices and visits with your health care provider that can promote health and wellness. What does preventive care include?  A yearly physical exam. This is also called an annual well check.  Dental exams once or twice a year.  Routine eye exams. Ask your health care provider how often you should have your eyes checked.  Personal lifestyle choices, including: ? Daily care of your teeth and gums. ? Regular physical activity. ? Eating a healthy diet. ? Avoiding tobacco and drug use. ? Limiting alcohol use. ? Practicing safe sex. ? Taking low-dose aspirin every day starting at age 51. What happens during an annual well check? The services and screenings done by your health care provider during your annual well check will depend on your age, overall health, lifestyle risk factors, and family history of disease. Counseling Your health care provider may ask you questions about your:  Alcohol use.  Tobacco use.  Drug use.  Emotional well-being.  Home and relationship well-being.  Sexual activity.  Eating habits.  Work and work Statistician.  Screening You may have the following tests or measurements:  Height, weight, and BMI.  Blood pressure.  Lipid and cholesterol levels. These may be checked every 5 years, or more frequently if you are over 52 years old.  Skin check.  Lung cancer screening. You may have this screening every year starting at age 64 if you have a 30-pack-year history of smoking and currently smoke or have quit within the past 15 years.  Fecal occult blood test (FOBT) of the stool. You may have this test every year starting at age 34.  Flexible sigmoidoscopy or colonoscopy. You may have a  sigmoidoscopy every 5 years or a colonoscopy every 10 years starting at age 4.  Prostate cancer screening. Recommendations will vary depending on your family history and other risks.  Hepatitis C blood test.  Hepatitis B blood test.  Sexually transmitted disease (STD) testing.  Diabetes screening. This is done by checking your blood sugar (glucose) after you have not eaten for a while (fasting). You may have this done every 1-3 years.  Discuss your test results, treatment options, and if necessary, the need for more tests with your health care provider. Vaccines Your health care provider may recommend certain vaccines, such as:  Influenza vaccine. This is recommended every year.  Tetanus, diphtheria, and acellular pertussis (Tdap, Td) vaccine. You may need a Td booster every 10 years.  Varicella vaccine. You may need this if you have not been vaccinated.  Zoster vaccine. You may need this after age 64.  Measles, mumps, and rubella (MMR) vaccine. You may need at least one dose of MMR if you were born in 1957 or later. You may also need a second dose.  Pneumococcal 13-valent conjugate (PCV13) vaccine. You may need this if you have certain conditions and have not been vaccinated.  Pneumococcal polysaccharide (PPSV23) vaccine. You may need one or two doses if you smoke cigarettes or if you have certain conditions.  Meningococcal vaccine. You may need this if you have certain conditions.  Hepatitis A vaccine. You may need this if you have certain conditions or if you travel or work in places where you  may be exposed to hepatitis A.  Hepatitis B vaccine. You may need this if you have certain conditions or if you travel or work in places where you may be exposed to hepatitis B.  Haemophilus influenzae type b (Hib) vaccine. You may need this if you have certain risk factors.  Talk to your health care provider about which screenings and vaccines you need and how often you need  them. This information is not intended to replace advice given to you by your health care provider. Make sure you discuss any questions you have with your health care provider. Document Released: 02/01/2015 Document Revised: 09/25/2015 Document Reviewed: 11/06/2014 Elsevier Interactive Patient Education  [employer].

## 2017-06-22 NOTE — Assessment & Plan Note (Signed)
Stable °Check CMET °

## 2017-06-22 NOTE — Assessment & Plan Note (Signed)
Check CBC 

## 2017-06-22 NOTE — Progress Notes (Signed)
Subjective:  Bruce Conner is a 62 y.o. male who presents today for his annual comprehensive physical exam.    HPI:  Lifestyle Diet: No specific diets. Trying to eat healthy. Exercise: Active at work.   Depression screen PHQ 2/9 06/22/2017  Decreased Interest 0  Down, Depressed, Hopeless 0  PHQ - 2 Score 0    Health Maintenance Due  Topic Date Due  . TETANUS/TDAP  11/26/1972     ROS: Per HPI, otherwise a complete review of systems was negative.   PMH:  The following were reviewed and entered/updated in epic: Past Medical History:  Diagnosis Date  . Hypertension   . Liver failure [facility])    Patient Active Problem List   Diagnosis Date Noted  . Status post liver transplant ([place]) 09/30/2015  . Liver failure ([place]) 11/02/2014  . Essential hypertension 10/29/2014  . Thrombocytopenia ([place]) 10/29/2014   Past Surgical History:  Procedure Laterality Date  . ABDOMINAL SURGERY    . LIVER TRANSPLANT      Family History  Problem Relation Age of Onset  . Hypertension Brother     Medications- reviewed and updated Current Outpatient Medications  Medication Sig Dispense Refill  . amLODipine (NORVASC) 10 MG tablet Take 1 tablet (10 mg total) by mouth daily. 10 tablet 0  . aspirin EC 81 MG tablet Take 1 tablet (81 mg total) by mouth daily. 90 tablet 3  . doxazosin (CARDURA) 8 MG tablet Take 1 tablet (8 mg total) by mouth daily. 90 tablet 0  . magnesium oxide (MAG-OX) 400 MG tablet Take 400 mg by mouth daily.    . mycophenolate (MYFORTIC) 360 MG TBEC EC tablet Take 360 mg by mouth 2 (two) times daily. Take 2 tablets (720 mg) 2 times daily    . omeprazole (PRILOSEC) 20 MG capsule Take 20 mg by mouth daily.    Marland Kitchen sulfamethoxazole-trimethoprim (BACTRIM,SEPTRA) 400-80 MG tablet Take 1 tablet by mouth 3 (three) times a week.     . tacrolimus (PROGRAF) 1 MG capsule Take 4 mg by mouth 2 (two) times daily. Take 4 capsules in the morning and 4 capsules in the evening     No current  facility-administered medications for this visit.     Allergies-reviewed and updated No Known Allergies  Social History   Socioeconomic History  . Marital status: Married    Spouse name: Not on file  . Number of children: 3  . Years of education: 72  . Highest education level: Not on file  Occupational History  . Occupation: Office manager  . Financial resource strain: Not on file  . Food insecurity:    Worry: Not on file    Inability: Not on file  . Transportation needs:    Medical: Not on file    Non-medical: Not on file  Tobacco Use  . Smoking status: Never Smoker  . Smokeless tobacco: Never Used  Substance and Sexual Activity  . Alcohol use: No  . Drug use: No  . Sexual activity: Not on file  Lifestyle  . Physical activity:    Days per week: Not on file    Minutes per session: Not on file  . Stress: Not on file  Relationships  . Social connections:    Talks on phone: Not on file    Gets together: Not on file    Attends religious service: Not on file    Active member of club or organization: Not on file    Attends  meetings of clubs or organizations: Not on file    Relationship status: Not on file  Other Topics Concern  . Not on file  Social History Narrative   Fun: Drag racing     Objective:  Physical Exam: BP 132/84 (BP Location: Left Arm, Patient Position: Sitting, Cuff Size: Normal)   Pulse 91   Temp 97.6 F (36.4 C) (Oral)   Ht 5\' 5"  (1.651 m)   Wt 168 lb 12.8 oz (76.6 kg)   SpO2 97%   BMI 28.09 kg/m   Body mass index is 28.09 kg/m. Wt Readings from Last 3 Encounters:  06/22/17 168 lb 12.8 oz (76.6 kg)  02/05/17 174 lb 3.2 oz (79 kg)  10/27/16 166 lb 8 oz (75.5 kg)   Gen: NAD, resting comfortably HEENT: TMs normal bilaterally. OP clear. No thyromegaly noted.  CV: RRR with no murmurs appreciated Pulm: NWOB, CTAB with no crackles, wheezes, or rhonchi GI: Normal bowel sounds present. Soft, Nontender, Nondistended. MSK: no edema,  cyanosis, or clubbing noted Skin: warm, dry Neuro: CN2-12 grossly intact. Strength 5/5 in upper and lower extremities. Reflexes symmetric and intact bilaterally.  Psych: Normal affect and thought content  Assessment/Plan:  Essential hypertension At goal.  Continue amlodipine 10 mg daily and Cardura 8 mg daily.  Check CBC, CMET, and TSH.  Thrombocytopenia (HCC) Check CBC.  Status post liver transplant ([place]) Stable.  Check CMET.  Hyperglycemia Check A1c.  Preventative Healthcare: Check lipid panel today.  Check PSA.  Patient has had a high risk for osteoporosis given his chronic prednisone use due to liver transplant-will obtain DEXA scan.  Patient Counseling(The following topics were reviewed and/or handout was given):  -Nutrition: Stressed importance of moderation in sodium/caffeine intake, saturated fat and cholesterol, caloric balance, sufficient intake of fresh fruits, vegetables, and fiber.  -Stressed the importance of regular exercise.   -Substance Abuse: Discussed cessation/primary prevention of tobacco, alcohol, or other drug use; driving or other dangerous activities under the influence; availability of treatment for abuse.   -Injury prevention: Discussed safety belts, safety helmets, smoke detector, smoking near bedding or upholstery.   -Sexuality: Discussed sexually transmitted diseases, partner selection, use of condoms, avoidance of unintended pregnancy and contraceptive alternatives.   -Dental health: Discussed importance of regular tooth brushing, flossing, and dental visits.  -Health maintenance and immunizations reviewed. Please refer to Health maintenance section.  Return to care in 1 year for next preventative visit.   Bruce Conner. [name], MD 06/22/2017 11:50 AM

## 2017-06-23 ENCOUNTER — Ambulatory Visit (INDEPENDENT_AMBULATORY_CARE_PROVIDER_SITE_OTHER)
Admission: RE | Admit: 2017-06-23 | Discharge: 2017-06-23 | Disposition: A | Discharge status: Home / Self Care | Payer: 59 | Source: Ambulatory Visit | Attending: Family Medicine | Admitting: Family Medicine

## 2017-06-23 DIAGNOSIS — Z9189 Other specified personal risk factors, not elsewhere classified: Secondary | ICD-10-CM

## 2017-06-23 DIAGNOSIS — Z944 Liver transplant status: Secondary | ICD-10-CM

## 2017-06-23 DIAGNOSIS — Z7952 Long term (current) use of systemic steroids: Secondary | ICD-10-CM | POA: Diagnosis not present

## 2017-06-23 DIAGNOSIS — Z13820 Encounter for screening for osteoporosis: Secondary | ICD-10-CM

## 2017-06-24 ENCOUNTER — Ambulatory Visit
Admission: RE | Admit: 2017-06-24 | Discharge: 2017-06-24 | Disposition: A | Discharge status: Home / Self Care | Payer: 59 | Source: Ambulatory Visit | Attending: Nurse Practitioner | Admitting: Nurse Practitioner

## 2017-06-24 DIAGNOSIS — Z944 Liver transplant status: Secondary | ICD-10-CM

## 2017-07-26 DIAGNOSIS — N179 Acute kidney failure, unspecified: Secondary | ICD-10-CM | POA: Diagnosis not present

## 2017-07-26 DIAGNOSIS — N183 Chronic kidney disease, stage 3 (moderate): Secondary | ICD-10-CM | POA: Diagnosis not present

## 2017-07-26 LAB — HEPATIC FUNCTION PANEL
ALT: 14 (ref 10–40)
AST: 24 (ref 14–40)
Alkaline Phosphatase: 108 (ref 25–125)
Bilirubin, Total: 0.4

## 2017-07-26 LAB — BASIC METABOLIC PANEL
BUN: 17 (ref 4–21)
CREATININE: 1.7 — AB (ref 0.6–1.3)
Glucose: 111
POTASSIUM: 4 (ref 3.4–5.3)
Sodium: 143 (ref 137–147)

## 2017-07-26 LAB — CBC AND DIFFERENTIAL
HEMATOCRIT: 37 — AB (ref 41–53)
HEMOGLOBIN: 12.3 — AB (ref 13.5–17.5)
Neutrophils Absolute: 2
Platelets: 123 — AB (ref 150–399)
WBC: 2.9

## 2017-08-26 DIAGNOSIS — D899 Disorder involving the immune mechanism, unspecified: Secondary | ICD-10-CM | POA: Diagnosis not present

## 2017-08-26 DIAGNOSIS — B259 Cytomegaloviral disease, unspecified: Secondary | ICD-10-CM | POA: Diagnosis not present

## 2017-08-26 DIAGNOSIS — Z4823 Encounter for aftercare following liver transplant: Secondary | ICD-10-CM | POA: Diagnosis not present

## 2017-09-02 ENCOUNTER — Encounter: Payer: Self-pay | Admitting: Physical Therapy

## 2017-10-15 ENCOUNTER — Telehealth: Payer: Self-pay | Admitting: Family Medicine

## 2017-10-15 NOTE — Telephone Encounter (Signed)
Patient requesting refill on tacrolimus (PROGRAF) 1 MG capsule and mycophenolate (MYFORTIC) 360 MG TBEC EC tablet [id number] [place], [address] AT [place] [phone] (Phone) [phone] (Fax)

## 2017-10-15 NOTE — Telephone Encounter (Signed)
Should come from his transplant doctor.  I am ok with refilling as long as he is on a stable dose, but want to make sure that we are not duplicating his meds and that someone is monitoring his levels.  Algis Greenhouse. [name], MD 10/15/2017 8:15 PM

## 2017-10-15 NOTE — Telephone Encounter (Signed)
Historical med.  Please advise.

## 2017-10-18 ENCOUNTER — Other Ambulatory Visit: Payer: Self-pay | Admitting: Family Medicine

## 2017-10-18 ENCOUNTER — Telehealth: Payer: Self-pay | Admitting: Family Medicine

## 2017-10-18 ENCOUNTER — Other Ambulatory Visit: Payer: Self-pay

## 2017-10-18 MED ORDER — TACROLIMUS 1 MG PO CAPS: 4.0000 mg | ORAL_CAPSULE | Freq: Two times a day (BID) | ORAL | 0 refills | Status: AC

## 2017-10-18 MED ORDER — MYCOPHENOLATE SODIUM 360 MG PO TBEC: 720.0000 mg | DELAYED_RELEASE_TABLET | Freq: Two times a day (BID) | ORAL | 0 refills | Status: DC

## 2017-10-18 NOTE — Telephone Encounter (Signed)
See other message

## 2017-10-18 NOTE — Telephone Encounter (Signed)
Copied from [place] [phone]. Topic: Quick Communication - Rx Refill/Question >> Oct 18, 2017 12:13 PM Oliver Pila B wrote: Medication: tacrolimus (PROGRAF) 1 MG capsule [[id number]]   Has the patient contacted their pharmacy? Yes.   (Agent: If no, request that the patient contact the pharmacy for the refill.) (Agent: If yes, when and what did the pharmacy advise?)  Preferred Pharmacy (with phone number or street name): [facility]  Agent: Please be advised that RX refills may take up to 3 business days. We ask that you follow-up with your pharmacy.

## 2017-10-18 NOTE — Telephone Encounter (Signed)
See note

## 2017-10-18 NOTE — Telephone Encounter (Signed)
Rx sent to pharmacy with note that future refills must come from transplant doctor.  LM for patient informing him that future refills must come from his transplant doctor.

## 2017-10-20 ENCOUNTER — Other Ambulatory Visit: Payer: Self-pay

## 2017-10-20 ENCOUNTER — Encounter (HOSPITAL_COMMUNITY): Payer: Self-pay | Admitting: Emergency Medicine

## 2017-10-20 ENCOUNTER — Emergency Department (HOSPITAL_COMMUNITY)
Admission: EM | Admit: 2017-10-20 | Discharge: 2017-10-20 | Disposition: A | Discharge status: Home / Self Care | Payer: 59 | Attending: Emergency Medicine | Admitting: Emergency Medicine

## 2017-10-20 DIAGNOSIS — Z760 Encounter for issue of repeat prescription: Secondary | ICD-10-CM | POA: Diagnosis not present

## 2017-10-20 DIAGNOSIS — I10 Essential (primary) hypertension: Secondary | ICD-10-CM | POA: Insufficient documentation

## 2017-10-20 DIAGNOSIS — Z79899 Other long term (current) drug therapy: Secondary | ICD-10-CM | POA: Insufficient documentation

## 2017-10-20 DIAGNOSIS — Z7982 Long term (current) use of aspirin: Secondary | ICD-10-CM | POA: Diagnosis not present

## 2017-10-20 MED ORDER — MYCOPHENOLATE SODIUM 180 MG PO TBEC
720.0000 mg | DELAYED_RELEASE_TABLET | Freq: Once | ORAL | Status: DC
  Filled 2017-10-20: qty 4

## 2017-10-20 MED ORDER — TACROLIMUS 1 MG PO CAPS
4.0000 mg | ORAL_CAPSULE | Freq: Once | ORAL | Status: AC
  Administered 2017-10-20: 4 mg via ORAL
  Filled 2017-10-20 (×2): qty 4

## 2017-10-20 NOTE — ED Provider Notes (Addendum)
[facility] EMERGENCY DEPARTMENT Provider Note  CSN: [id number] Arrival date & time: 10/20/17 0025  Chief Complaint(s) Medication Refill  HPI Bruce Conner is a 62 y.o. male with a history of liver failure on Myfortic and Prograft presents to the emergency department for medication refill of his prograft.  Patient reports that he ran out of his medications and took his last dose yesterday.  States that he went to his PCPs office and asked for refill.  Spoke with the pharmacy and refill not ready.  Denies any acute complaints at this time.  On review of record, there is a note regarding a telephone call on 9/30, and a note for refill for his medication.  Tye [place]       10/18/17 12:14 PM  Note    Copied from [place] [phone]. Topic: Quick Communication - Rx Refill/Question >> Oct 18, 2017 12:13 PM Oliver Pila B wrote: Medication: tacrolimus (PROGRAF) 1 MG capsule [[id number]]   Has the patient contacted their pharmacy? Yes.   (Agent: If no, request that the patient contact the pharmacy for the refill.) (Agent: If yes, when and what did the pharmacy advise?)  Preferred Pharmacy (with phone number or street name): [facility]  Agent: Please be advised that RX refills may take up to 3 business days. We ask that you follow-up with your pharmacy.       Disp Refills Start End  mycophenolate (MYFORTIC) 360 MG TBEC EC tablet (Discontinued) 120 tablet 0 10/18/2017 10/18/2017  Take 2 tablets (720 mg total) by mouth 2 (two) times daily. - Oral  Notes to Pharmacy: FUTURE REFILLS MUST COME FROM TRANSPLANT DOCTOR. PLEASE INFORM PATIENT  Reason for Discontinue: Reorder  tacrolimus (PROGRAF) 1 MG capsule 240 capsule 0 10/18/2017   Take 4 capsules (4 mg total) by mouth 2 (two) times daily. Take 4 capsules in the morning and 4 capsules in the evening - Oral  Notes to Pharmacy: FUTURE REFILLS MUST COME FROM TRANSPLANT DOCTOR. PLEASE INFORM PATIENT      HPI  Past Medical History Past Medical History:  Diagnosis Date  . Hypertension   . Liver failure [facility])    Patient Active Problem List   Diagnosis Date Noted  . Status post liver transplant ([place]) 09/30/2015  . Liver failure ([place]) 11/02/2014  . Essential hypertension 10/29/2014  . Thrombocytopenia ([place]) 10/29/2014   Home Medication(s) Prior to Admission medications   Medication Sig Start Date End Date Taking? Authorizing Provider  amLODipine (NORVASC) 10 MG tablet Take 1 tablet (10 mg total) by mouth daily. 06/22/16   Golden Circle, FNP  aspirin EC 81 MG tablet Take 1 tablet (81 mg total) by mouth daily. 07/21/16   Golden Circle, FNP  doxazosin (CARDURA) 8 MG tablet Take 1 tablet (8 mg total) by mouth daily. 10/27/16   Golden Circle, FNP  magnesium oxide (MAG-OX) 400 MG tablet Take 400 mg by mouth daily.    [provider]  mycophenolate (MYFORTIC) 360 MG TBEC EC tablet TAKE 2 TABLET BY MOUTH TWICE DAILY 10/18/17   Vivi Barrack, MD  omeprazole (PRILOSEC) 20 MG capsule Take 20 mg by mouth daily.    [provider]  sulfamethoxazole-trimethoprim (BACTRIM,SEPTRA) 400-80 MG tablet Take 1 tablet by mouth 3 (three) times a week.     [provider]  tacrolimus (PROGRAF) 1 MG capsule Take 4 capsules (4 mg total) by mouth 2 (two) times daily. Take 4 capsules in the morning and 4 capsules in  the evening 10/18/17   Vivi Barrack, MD                                                                                                                                    Past Surgical History Past Surgical History:  Procedure Laterality Date  . ABDOMINAL SURGERY    . LIVER TRANSPLANT     Family History Family History  Problem Relation Age of Onset  . Hypertension Brother     Social History Social History   Tobacco Use  . Smoking status: Never Smoker  . Smokeless tobacco: Never Used  Substance Use Topics  . Alcohol use: No  . Drug use: No    Allergies Patient has no known allergies.  Review of Systems Review of Systems All other systems are reviewed and are negative for acute change except as noted in the HPI  Physical Exam Vital Signs  I have reviewed the triage vital signs BP (!) 147/87   Pulse (!) 58   Temp 98.1 F (36.7 C)   Resp 16   SpO2 99%   Physical Exam  Constitutional: He is oriented to person, place, and time. He appears well-developed and well-nourished. No distress.  HENT:  Head: Normocephalic and atraumatic.  Right Ear: External ear normal.  Left Ear: External ear normal.  Nose: Nose normal.  Mouth/Throat: Mucous membranes are normal. No trismus in the jaw.  Eyes: Conjunctivae and EOM are normal. No scleral icterus.  Neck: Normal range of motion and phonation normal.  Cardiovascular: Normal rate and regular rhythm.  Pulmonary/Chest: Effort normal. No stridor. No respiratory distress.  Abdominal: He exhibits no distension.  Musculoskeletal: Normal range of motion. He exhibits no edema.  Neurological: He is alert and oriented to person, place, and time.  Skin: He is not diaphoretic.  Psychiatric: He has a normal mood and affect. His behavior is normal.  Vitals reviewed.   ED Results and Treatments Labs (all labs ordered are listed, but only abnormal results are displayed) Labs Reviewed - No data to display                                                                                                                       EKG  EKG Interpretation  Date/Time:    Ventricular Rate:    PR Interval:    QRS Duration:   QT Interval:    QTC Calculation:  R Axis:     Text Interpretation:        Radiology No results found. Pertinent labs & imaging results that were available during my care of the patient were reviewed by me and considered in my medical decision making (see chart for details).  Medications Ordered in ED Medications  tacrolimus (PROGRAF) capsule 4 mg (4 mg Oral Given  10/20/17 0329)                                                                                                                                    Procedures Procedures  (including critical care time)  Medical Decision Making / ED Course I have reviewed the nursing notes for this encounter and the patient's prior records (if available in EHR or on provided paperwork).    We will give evening dose of medication.  We will have patient follow-up with his pharmacy for his refills.  Patient made aware that he is to follow-up with his transplant doctors regarding his future refills.  The patient appears reasonably screened and/or stabilized for discharge and I doubt any other medical condition or other [facility] requiring further screening, evaluation, or treatment in the ED at this time prior to discharge.  The patient is safe for discharge with strict return precautions.   Final Clinical Impression(s) / ED Diagnoses Final diagnoses:  Medication refill    Disposition: Discharge  Condition: Good    ED Discharge Orders    None       Follow Up: Vivi Barrack, MD [place] [id number] [phone]  Schedule an appointment as soon as possible for a visit  As needed  Transplant Dr.  Schedule an appointment as soon as possible for a visit  As needed for future refills     This chart was dictated using voice recognition software.  Despite best efforts to proofread,  errors can occur which can change the documentation meaning.     Fatima Blank, MD 10/20/17 [phone]

## 2017-10-20 NOTE — ED Triage Notes (Signed)
Pt states that he has not been able to get into see his doctor for a refill of his anti rejection medications for his liver transplant 3 years ago.  No complaints at this time.

## 2017-10-20 NOTE — Discharge Instructions (Signed)
This is the information in the system regarding your refills.  The orders have been made.  Please contact your pharmacy regarding your medication.  Please be aware that they are requesting that you follow-up with your transplant doctor regarding any future refills.    Disp Refills Start End  mycophenolate (MYFORTIC) 360 MG TBEC EC tablet (Discontinued) 120 tablet 0 10/18/2017 10/18/2017  Take 2 tablets (720 mg total) by mouth 2 (two) times daily. - Oral  Notes to Pharmacy: FUTURE REFILLS MUST COME FROM TRANSPLANT DOCTOR.  PLEASE INFORM PATIENT  Reason for Discontinue: Reorder  tacrolimus (PROGRAF) 1 MG capsule 240 capsule 0 10/18/2017   Take 4 capsules (4 mg total) by mouth 2 (two) times daily. Take 4 capsules in the morning and 4 capsules in the evening - Oral  Notes to Pharmacy: FUTURE REFILLS MUST COME FROM TRANSPLANT DOCTOR.  PLEASE INFORM PATIENT

## 2017-10-20 NOTE — ED Notes (Signed)
Patient verbalizes understanding of discharge instructions. Opportunity for questioning and answers were provided. Armband removed by staff, pt discharged from ED to home via POV  

## 2017-11-24 DIAGNOSIS — Z944 Liver transplant status: Secondary | ICD-10-CM | POA: Diagnosis not present

## 2017-11-24 DIAGNOSIS — D899 Disorder involving the immune mechanism, unspecified: Secondary | ICD-10-CM | POA: Diagnosis not present

## 2018-01-24 DIAGNOSIS — N183 Chronic kidney disease, stage 3 (moderate): Secondary | ICD-10-CM | POA: Diagnosis not present

## 2018-01-24 DIAGNOSIS — Z944 Liver transplant status: Secondary | ICD-10-CM | POA: Diagnosis not present

## 2018-01-24 LAB — CBC AND DIFFERENTIAL
HEMATOCRIT: 37 — AB (ref 41–53)
HEMOGLOBIN: 12.4 — AB (ref 13.5–17.5)
Neutrophils Absolute: 2
Platelets: 135 — AB (ref 150–399)

## 2018-01-24 LAB — BASIC METABOLIC PANEL
BUN: 23 — AB (ref 4–21)
Creatinine: 1.5 — AB (ref 0.6–1.3)
Glucose: 99
POTASSIUM: 4.2 (ref 3.4–5.3)
SODIUM: 141 (ref 137–147)

## 2018-01-24 LAB — HEPATIC FUNCTION PANEL
ALK PHOS: 113 (ref 25–125)
Bilirubin, Total: 0.5

## 2018-01-28 ENCOUNTER — Encounter: Payer: Self-pay | Admitting: Family Medicine

## 2018-02-15 DIAGNOSIS — D899 Disorder involving the immune mechanism, unspecified: Secondary | ICD-10-CM | POA: Diagnosis not present

## 2018-02-15 DIAGNOSIS — Z4823 Encounter for aftercare following liver transplant: Secondary | ICD-10-CM | POA: Diagnosis not present

## 2018-02-15 DIAGNOSIS — B259 Cytomegaloviral disease, unspecified: Secondary | ICD-10-CM | POA: Diagnosis not present

## 2018-06-02 ENCOUNTER — Encounter (HOSPITAL_COMMUNITY): Payer: Self-pay | Admitting: Emergency Medicine

## 2018-06-02 ENCOUNTER — Emergency Department (HOSPITAL_COMMUNITY)
Admission: EM | Admit: 2018-06-02 | Discharge: 2018-06-02 | Disposition: A | Discharge status: Home / Self Care | Payer: 59 | Attending: Emergency Medicine | Admitting: Emergency Medicine

## 2018-06-02 ENCOUNTER — Emergency Department (HOSPITAL_COMMUNITY): Payer: 59

## 2018-06-02 DIAGNOSIS — R0789 Other chest pain: Secondary | ICD-10-CM | POA: Insufficient documentation

## 2018-06-02 DIAGNOSIS — Z79899 Other long term (current) drug therapy: Secondary | ICD-10-CM | POA: Diagnosis not present

## 2018-06-02 DIAGNOSIS — N183 Chronic kidney disease, stage 3 (moderate): Secondary | ICD-10-CM | POA: Diagnosis not present

## 2018-06-02 DIAGNOSIS — R0602 Shortness of breath: Secondary | ICD-10-CM | POA: Diagnosis present

## 2018-06-02 DIAGNOSIS — R071 Chest pain on breathing: Secondary | ICD-10-CM | POA: Diagnosis not present

## 2018-06-02 DIAGNOSIS — Z944 Liver transplant status: Secondary | ICD-10-CM | POA: Diagnosis not present

## 2018-06-02 DIAGNOSIS — R0781 Pleurodynia: Secondary | ICD-10-CM | POA: Insufficient documentation

## 2018-06-02 DIAGNOSIS — I129 Hypertensive chronic kidney disease with stage 1 through stage 4 chronic kidney disease, or unspecified chronic kidney disease: Secondary | ICD-10-CM | POA: Diagnosis not present

## 2018-06-02 DIAGNOSIS — Z7982 Long term (current) use of aspirin: Secondary | ICD-10-CM | POA: Diagnosis not present

## 2018-06-02 LAB — URINALYSIS, ROUTINE W REFLEX MICROSCOPIC
Bilirubin Urine: NEGATIVE
Glucose, UA: NEGATIVE mg/dL
Hgb urine dipstick: NEGATIVE
Ketones, ur: NEGATIVE mg/dL
Leukocytes,Ua: NEGATIVE
Nitrite: NEGATIVE
Protein, ur: NEGATIVE mg/dL
Specific Gravity, Urine: 1.015 (ref 1.005–1.030)
pH: 6 (ref 5.0–8.0)

## 2018-06-02 LAB — CBC WITH DIFFERENTIAL/PLATELET
Abs Immature Granulocytes: 0.03 10*3/uL (ref 0.00–0.07)
Basophils Absolute: 0 10*3/uL (ref 0.0–0.1)
Basophils Relative: 0 %
Eosinophils Absolute: 0.1 10*3/uL (ref 0.0–0.5)
Eosinophils Relative: 3 %
HCT: 37.3 % — ABNORMAL LOW (ref 39.0–52.0)
Hemoglobin: 12.1 g/dL — ABNORMAL LOW (ref 13.0–17.0)
Immature Granulocytes: 1 %
Lymphocytes Relative: 20 %
Lymphs Abs: 0.7 10*3/uL (ref 0.7–4.0)
MCH: 29.4 pg (ref 26.0–34.0)
MCHC: 32.4 g/dL (ref 30.0–36.0)
MCV: 90.5 fL (ref 80.0–100.0)
Monocytes Absolute: 0.4 10*3/uL (ref 0.1–1.0)
Monocytes Relative: 11 %
Neutro Abs: 2.4 10*3/uL (ref 1.7–7.7)
Neutrophils Relative %: 65 %
Platelets: 137 10*3/uL — ABNORMAL LOW (ref 150–400)
RBC: 4.12 MIL/uL — ABNORMAL LOW (ref 4.22–5.81)
RDW: 12.7 % (ref 11.5–15.5)
WBC: 3.6 10*3/uL — ABNORMAL LOW (ref 4.0–10.5)
nRBC: 0 % (ref 0.0–0.2)

## 2018-06-02 LAB — BASIC METABOLIC PANEL
Anion gap: 10 (ref 5–15)
BUN: 16 mg/dL (ref 8–23)
CO2: 23 mmol/L (ref 22–32)
Calcium: 9.5 mg/dL (ref 8.9–10.3)
Chloride: 105 mmol/L (ref 98–111)
Creatinine, Ser: 1.57 mg/dL — ABNORMAL HIGH (ref 0.61–1.24)
GFR calc Af Amer: 53 mL/min — ABNORMAL LOW (ref >60)
GFR calc non Af Amer: 46 mL/min — ABNORMAL LOW (ref >60)
Glucose, Bld: 98 mg/dL (ref 70–99)
Potassium: 3.9 mmol/L (ref 3.5–5.1)
Sodium: 138 mmol/L (ref 135–145)

## 2018-06-02 MED ORDER — IOHEXOL 350 MG/ML SOLN
100.0000 mL | Freq: Once | INTRAVENOUS | Status: AC | PRN
  Administered 2018-06-02: 13:00:00 58 mL via INTRAVENOUS

## 2018-06-02 MED ORDER — TRAMADOL HCL 50 MG PO TABS: 50.0000 mg | ORAL_TABLET | Freq: Four times a day (QID) | ORAL | 0 refills | Status: DC | PRN

## 2018-06-02 NOTE — ED Notes (Signed)
Pt does not have any palpable veins in the area needed for CTA, Pt states IV team is needed to place IV, unless it is in his hand.

## 2018-06-02 NOTE — ED Notes (Signed)
This RN acting as Art therapist and asked pt if I could update any family or friends for him. Pt asked if I would call his wife, Estill Bamberg. I called, but she did not answer. Will try again.

## 2018-06-02 NOTE — ED Notes (Signed)
Pt ambulatory with steady gait. SpO2 between 93% and 98%. Pt stated he did not feel short of breath or dizzy.

## 2018-06-02 NOTE — ED Provider Notes (Signed)
[facility]ERGENCY DEPARTMENT Provider Note   CSN: [id number] Arrival date & time: 06/02/18  1006    History   Chief Complaint Chief Complaint  Patient presents with  . Shortness of Breath  . Chest Pain    HPI Bruce Conner is a 63 y.o. male.     HPI Patient presents to the emergency department with left sided lateral chest pain.  Patient states that certain movements make the pain worse and taking a deep breath.  Patient states that pain started yesterday evening.  The patient states that he is not had any significant shortness of breath but when he has the pain he feels more trouble breathing.  Patient states that nothing seems to make the condition better.  He states he did not take any medications prior to arrival for symptoms.  The patient denies  shortness of breath, headache,blurred vision, neck pain, fever, cough, weakness, numbness, dizziness, anorexia, edema, abdominal pain, nausea, vomiting, diarrhea, rash, back pain, dysuria, hematemesis, bloody stool, near syncope, or syncope. Past Medical History:  Diagnosis Date  . Acute renal injury ([place])   . Chronic kidney disease, stage III (moderate) (HCC)   . Cirrhosis of liver ([place])   . Hypertension   . Hypomagnesemia   . Hypophosphatemia   . Liver failure ([place])   . Pyorrhea   . S/P liver transplant ([place]) 11/12/2014   @ [facility], Cellcept and Prograf    Patient Active Problem List   Diagnosis Date Noted  . Status post liver transplant ([place]) 09/30/2015  . Liver failure ([place]) 11/02/2014  . Essential hypertension 10/29/2014  . Thrombocytopenia ([place]) 10/29/2014    Past Surgical History:  Procedure Laterality Date  . ABDOMINAL SURGERY    . LIVER TRANSPLANT  11/12/2014   [place]        Home Medications    Prior to Admission medications   Medication Sig Start Date End Date Taking? Authorizing Provider  amLODipine (NORVASC) 10 MG tablet Take 1 tablet (10 mg total) by mouth daily. 06/22/16  Yes  Golden Circle, FNP  aspirin EC 81 MG tablet Take 1 tablet (81 mg total) by mouth daily. 07/21/16  Yes Golden Circle, FNP  doxazosin (CARDURA) 8 MG tablet Take 1 tablet (8 mg total) by mouth daily. 10/27/16  Yes Golden Circle, FNP  K Phos Mono-Sod Phos Di & Mono (VIRT-PHOS 250 NEUTRAL) [id number] MG TABS Take 2 tablets by mouth daily. 05/20/18  Yes [provider]  magnesium oxide (MAG-OX) 400 MG tablet Take 400 mg by mouth daily.   Yes [provider]  mycophenolate (MYFORTIC) 360 MG TBEC EC tablet TAKE 2 TABLET BY MOUTH TWICE DAILY Patient taking differently: Take 720 mg by mouth 2 (two) times daily.  10/18/17  Yes Vivi Barrack, MD  omeprazole (PRILOSEC) 20 MG capsule Take 20 mg by mouth daily.   Yes [provider]  sulfamethoxazole-trimethoprim (BACTRIM,SEPTRA) 400-80 MG tablet Take 1 tablet by mouth every Monday, Wednesday, and Friday.    Yes [provider]  tacrolimus (PROGRAF) 1 MG capsule Take 4 capsules (4 mg total) by mouth 2 (two) times daily. Take 4 capsules in the morning and 4 capsules in the evening 10/18/17  Yes Vivi Barrack, MD    Family History Family History  Problem Relation Age of Onset  . Hypertension Brother     Social History Social History   Tobacco Use  . Smoking status: Never Smoker  . Smokeless tobacco: Never Used  Substance Use Topics  . Alcohol use: No  . Drug use: No     Allergies   Patient has no known allergies.   Review of Systems Review of Systems All other systems negative except as documented in the HPI. All pertinent positives and negatives as reviewed in the HPI.  Physical Exam Updated Vital Signs BP (!) 142/66   Pulse (!) 58   Temp 98.8 F (37.1 C) (Oral)   Resp 15   SpO2 97%   Physical Exam Vitals signs and nursing note reviewed.  Constitutional:      General: He is not in acute distress.    Appearance: He is well-developed.  HENT:     Head: Normocephalic and atraumatic.   Eyes:     Pupils: Pupils are equal, round, and reactive to light.  Neck:     Musculoskeletal: Normal range of motion and neck supple.  Cardiovascular:     Rate and Rhythm: Normal rate and regular rhythm.     Heart sounds: Normal heart sounds. No murmur. No friction rub. No gallop.   Pulmonary:     Effort: Pulmonary effort is normal. No tachypnea, accessory muscle usage or respiratory distress.     Breath sounds: Normal breath sounds. No stridor. No decreased breath sounds, wheezing, rhonchi or rales.  Chest:     Chest wall: Tenderness present.  Abdominal:     General: Bowel sounds are normal. There is no distension.     Palpations: Abdomen is soft.     Tenderness: There is no abdominal tenderness.  Skin:    General: Skin is warm and dry.     Capillary Refill: Capillary refill takes less than 2 seconds.     Findings: No erythema or rash.  Neurological:     Mental Status: He is alert and oriented to person, place, and time.     Motor: No abnormal muscle tone.     Coordination: Coordination normal.  Psychiatric:        Behavior: Behavior normal.      ED Treatments / Results  Labs (all labs ordered are listed, but only abnormal results are displayed) Labs Reviewed  CBC WITH DIFFERENTIAL/PLATELET - Abnormal; Notable for the following components:      Result Value   WBC 3.6 (*)    RBC 4.12 (*)    Hemoglobin 12.1 (*)    HCT 37.3 (*)    Platelets 137 (*)    All other components within normal limits  BASIC METABOLIC PANEL - Abnormal; Notable for the following components:   Creatinine, Ser 1.57 (*)    GFR calc non Af Amer 46 (*)    GFR calc Af Amer 53 (*)    All other components within normal limits  URINALYSIS, ROUTINE W REFLEX MICROSCOPIC    EKG None  Radiology Ct Angio Chest Pe W/cm &/or Wo Cm  Result Date: 06/02/2018 CLINICAL DATA:  Shortness of breath and chest pain EXAM: CT ANGIOGRAPHY CHEST WITH CONTRAST TECHNIQUE: Multidetector CT imaging of the chest was  performed using the standard protocol during bolus administration of intravenous contrast. Multiplanar CT image reconstructions and MIPs were obtained to evaluate the vascular anatomy. CONTRAST:  50mL OMNIPAQUE IOHEXOL 350 MG/ML SOLN COMPARISON:  None. FINDINGS: Cardiovascular: There is no demonstrable pulmonary embolus. There is no thoracic aortic aneurysm or dissection. Visualized great vessels appear unremarkable. There is no pericardial effusion or pericardial thickening. The main pulmonary tract is prominent measuring 3.2 cm in diameter. Mediastinum/Nodes: Visualized thyroid appears normal. There  is no appreciable thoracic adenopathy. No esophageal lesions are evident. Lungs/Pleura: There are areas of mild atelectatic change bilaterally. There is somewhat mosaic attenuation of the lungs, suggesting a degree of underlying small airways obstructive disease. There is no airspace consolidation or well-defined ground-glass type opacity. There is no pleural effusion or pleural thickening evident. Upper Abdomen: Visualized upper abdominal structures appear unremarkable. Musculoskeletal: There is degenerative change in the thoracic spine. There are no blastic or lytic bone lesions. There is an apparent lipoma in the serratus anterior muscle measuring 3.2 x 2.4 cm. Review of the MIP images confirms the above findings. IMPRESSION: 1. No demonstrable pulmonary embolus. No thoracic aortic aneurysm or dissection. 2. Prominence of the main pulmonary outflow tract, a finding felt to be indicative of a degree of pulmonary arterial hypertension. 3. Somewhat mosaic attenuation in lungs, likely indicative of small airways obstructive disease. No frank consolidation. Areas of mild atelectatic change. 4.  No demonstrable thoracic adenopathy. Electronically Signed   By: Lowella Grip III M.D.   On: 06/02/2018 13:30    Procedures Procedures (including critical care time)  Medications Ordered in ED Medications  iohexol  (OMNIPAQUE) 350 MG/ML injection 100 mL (58 mLs Intravenous Contrast Given 06/02/18 1320)     Initial Impression / Assessment and Plan / ED Course  I have reviewed the triage vital signs and the nursing notes.  Pertinent labs & imaging results that were available during my care of the patient were reviewed by me and considered in my medical decision making (see chart for details).        Feel that the patient's symptoms are most likely chest wall based on his HPI and physical exam findings.  Patient CT scan of the chest does not show any sign of pulmonary embolus.  I feel that this is less likely cardiac at this point based on the very specific area of pain in the lateral chest wall in the axillary line.  Final Clinical Impressions(s) / ED Diagnoses   Final diagnoses:  None    ED Discharge Orders    None       Dalia Heading, PA-C 06/03/18 1443    Davonna Belling, MD 06/06/18 [phone]

## 2018-06-02 NOTE — ED Notes (Signed)
Patient verbalizes understanding of discharge instructions. Opportunity for questioning and answers were provided. Armband removed by staff, pt discharged from ED.  

## 2018-06-02 NOTE — ED Notes (Signed)
Called CT to let them know pt has IV and is ready for scan.

## 2018-06-02 NOTE — Discharge Instructions (Addendum)
Return here as needed.  Follow-up with your primary doctor. °

## 2018-06-02 NOTE — ED Triage Notes (Signed)
Pt in with c/o L side rib pain since last night. States the pain is worse with deep breaths. No injuries reported, does have hx of liver transplant. Denies any outright cp, but pain does radiate to L chest wall

## 2018-06-02 NOTE — ED Notes (Signed)
Patient transported to CT 

## 2018-06-02 NOTE — ED Notes (Signed)
Pt states he recently called his wife.

## 2018-06-16 ENCOUNTER — Other Ambulatory Visit: Payer: Self-pay | Admitting: Nurse Practitioner

## 2018-06-16 DIAGNOSIS — Z944 Liver transplant status: Secondary | ICD-10-CM

## 2018-06-20 ENCOUNTER — Ambulatory Visit
Admission: RE | Admit: 2018-06-20 | Discharge: 2018-06-20 | Disposition: A | Discharge status: Home / Self Care | Payer: 59 | Source: Ambulatory Visit | Attending: Nurse Practitioner | Admitting: Nurse Practitioner

## 2018-06-20 ENCOUNTER — Other Ambulatory Visit: Payer: Self-pay | Admitting: Nurse Practitioner

## 2018-06-20 DIAGNOSIS — Z944 Liver transplant status: Secondary | ICD-10-CM

## 2018-12-06 ENCOUNTER — Encounter: Payer: Self-pay | Admitting: Family Medicine

## 2018-12-06 ENCOUNTER — Other Ambulatory Visit: Payer: Self-pay

## 2018-12-06 ENCOUNTER — Ambulatory Visit (INDEPENDENT_AMBULATORY_CARE_PROVIDER_SITE_OTHER): Payer: 59 | Admitting: Family Medicine

## 2018-12-06 VITALS — BP 131/79 | HR 66 | Temp 98.7°F | Ht 65.0 in | Wt 168.0 lb

## 2018-12-06 DIAGNOSIS — E663 Overweight: Secondary | ICD-10-CM

## 2018-12-06 DIAGNOSIS — Z23 Encounter for immunization: Secondary | ICD-10-CM

## 2018-12-06 DIAGNOSIS — Z944 Liver transplant status: Secondary | ICD-10-CM

## 2018-12-06 DIAGNOSIS — Z6827 Body mass index (BMI) 27.0-27.9, adult: Secondary | ICD-10-CM

## 2018-12-06 DIAGNOSIS — I10 Essential (primary) hypertension: Secondary | ICD-10-CM | POA: Diagnosis not present

## 2018-12-06 DIAGNOSIS — Z125 Encounter for screening for malignant neoplasm of prostate: Secondary | ICD-10-CM

## 2018-12-06 DIAGNOSIS — Z0001 Encounter for general adult medical examination with abnormal findings: Secondary | ICD-10-CM

## 2018-12-06 DIAGNOSIS — Z13220 Encounter for screening for lipoid disorders: Secondary | ICD-10-CM

## 2018-12-06 DIAGNOSIS — D696 Thrombocytopenia, unspecified: Secondary | ICD-10-CM | POA: Diagnosis not present

## 2018-12-06 DIAGNOSIS — N529 Male erectile dysfunction, unspecified: Secondary | ICD-10-CM

## 2018-12-06 MED ORDER — TADALAFIL 10 MG PO TABS: 10.0000 mg | ORAL_TABLET | Freq: Every day | ORAL | 5 refills | Status: DC | PRN

## 2018-12-06 MED ORDER — DOXAZOSIN MESYLATE 8 MG PO TABS: 8.0000 mg | ORAL_TABLET | Freq: Every day | ORAL | 3 refills | Status: DC

## 2018-12-06 MED ORDER — AMLODIPINE BESYLATE 10 MG PO TABS: 10.0000 mg | ORAL_TABLET | Freq: Every day | ORAL | 3 refills | Status: DC

## 2018-12-06 NOTE — Assessment & Plan Note (Signed)
At goal. Continue norvasc 10mg  daily and cardura 8mg  daily. Check CBC, CMET, and TSH.

## 2018-12-06 NOTE — Assessment & Plan Note (Signed)
Stable. Check CMET. Continue management per hepatology.

## 2018-12-06 NOTE — Patient Instructions (Addendum)
It was very nice to see you today!  Please try the cialis.  Let me know if you have any side effects.  No other medication changes today.   We will give you your flu and pneumonia vaccines today.  Come back in a year for your next check up, or sooner if needed.    Take care, Dr [name]  Please try these tips to maintain a healthy lifestyle:   Eat at least 3 REAL meals and 1-2 snacks per day.  Aim for no more than 5 hours between eating.  If you eat breakfast, please do so within one hour of getting up.    Obtain twice as many fruits/vegetables as protein or carbohydrate foods for both lunch and dinner. (Half of each meal should be fruits/vegetables, one quarter protein, and one quarter starchy carbs)   Cut down on sweet beverages. This includes juice, soda, and sweet tea.    Exercise at least 150 minutes every week.    Preventive Care 49 Years and Older, Male Preventive care refers to lifestyle choices and visits with your health care provider that can promote health and wellness. This includes:  A yearly physical exam. This is also called an annual well check.  Regular dental and eye exams.  Immunizations.  Screening for certain conditions.  Healthy lifestyle choices, such as diet and exercise. What can I expect for my preventive care visit? Physical exam Your health care provider will check:  Height and weight. These may be used to calculate body mass index (BMI), which is a measurement that tells if you are at a healthy weight.  Heart rate and blood pressure.  Your skin for abnormal spots. Counseling Your health care provider may ask you questions about:  Alcohol, tobacco, and drug use.  Emotional well-being.  Home and relationship well-being.  Sexual activity.  Eating habits.  History of falls.  Memory and ability to understand (cognition).  Work and work Statistician. What immunizations do I need?  Influenza (flu) vaccine  This is recommended  every year. Tetanus, diphtheria, and pertussis (Tdap) vaccine  You may need a Td booster every 10 years. Varicella (chickenpox) vaccine  You may need this vaccine if you have not already been vaccinated. Zoster (shingles) vaccine  You may need this after age 89. Pneumococcal conjugate (PCV13) vaccine  One dose is recommended after age 30. Pneumococcal polysaccharide (PPSV23) vaccine  One dose is recommended after age 13. Measles, mumps, and rubella (MMR) vaccine  You may need at least one dose of MMR if you were born in 1957 or later. You may also need a second dose. Meningococcal conjugate (MenACWY) vaccine  You may need this if you have certain conditions. Hepatitis A vaccine  You may need this if you have certain conditions or if you travel or work in places where you may be exposed to hepatitis A. Hepatitis B vaccine  You may need this if you have certain conditions or if you travel or work in places where you may be exposed to hepatitis B. Haemophilus influenzae type b (Hib) vaccine  You may need this if you have certain conditions. You may receive vaccines as individual doses or as more than one vaccine together in one shot (combination vaccines). Talk with your health care provider about the risks and benefits of combination vaccines. What tests do I need? Blood tests  Lipid and cholesterol levels. These may be checked every 5 years, or more frequently depending on your overall health.  Hepatitis C  test.  Hepatitis B test. Screening  Lung cancer screening. You may have this screening every year starting at age 9 if you have a 30-pack-year history of smoking and currently smoke or have quit within the past 15 years.  Colorectal cancer screening. All adults should have this screening starting at age 22 and continuing until age 37. Your health care provider may recommend screening at age 51 if you are at increased risk. You will have tests every 1-10 years, depending  on your results and the type of screening test.  Prostate cancer screening. Recommendations will vary depending on your family history and other risks.  Diabetes screening. This is done by checking your blood sugar (glucose) after you have not eaten for a while (fasting). You may have this done every 1-3 years.  Abdominal aortic aneurysm (AAA) screening. You may need this if you are a current or former smoker.  Sexually transmitted disease (STD) testing. Follow these instructions at home: Eating and drinking  Eat a diet that includes fresh fruits and vegetables, whole grains, lean protein, and low-fat dairy products. Limit your intake of foods with high amounts of sugar, saturated fats, and salt.  Take vitamin and mineral supplements as recommended by your health care provider.  Do not drink alcohol if your health care provider tells you not to drink.  If you drink alcohol: ? Limit how much you have to 0-2 drinks a day. ? Be aware of how much alcohol is in your drink. In the [place] one drink equals one 12 oz bottle of beer (355 mL), one 5 oz glass of wine (148 mL), or one 1 oz glass of hard liquor (44 mL). Lifestyle  Take daily care of your teeth and gums.  Stay active. Exercise for at least 30 minutes on 5 or more days each week.  Do not use any products that contain nicotine or tobacco, such as cigarettes, e-cigarettes, and chewing tobacco. If you need help quitting, ask your health care provider.  If you are sexually active, practice safe sex. Use a condom or other form of protection to prevent STIs (sexually transmitted infections).  Talk with your health care provider about taking a low-dose aspirin or statin. What's next?  Visit your health care provider once a year for a well check visit.  Ask your health care provider how often you should have your eyes and teeth checked.  Stay up to date on all vaccines. This information is not intended to replace advice given to you  by your health care provider. Make sure you discuss any questions you have with your health care provider. Document Released: 02/01/2015 Document Revised: 12/30/2017 Document Reviewed: 12/30/2017 Elsevier Patient Education  2020 [employer].

## 2018-12-06 NOTE — Progress Notes (Signed)
Chief Complaint:  Bruce Conner is a 63 y.o. male who presents today for his annual comprehensive physical exam.    Assessment/Plan:  Erectile dysfunction Trial cialis. Discussed potential side effects. Will need urology referral if no improvement.   Status post liver transplant ([place]) Stable. Check CMET. Continue management per hepatology.   Thrombocytopenia (HCC) Check CBC.   Essential hypertension At goal. Continue norvasc 10mg  daily and cardura 8mg  daily. Check CBC, CMET, and TSH.   Body mass index is 27.96 kg/m. / Overweight BMI Metric Follow Up - 12/06/18 1453      BMI Metric Follow Up-Please document annually   BMI Metric Follow Up  Education provided        Preventative Healthcare: Will give pneumonia and flu vaccines today. Check CBC, CMET, TSH, lipid panel, and A1c. Check PSA. UTD on colon cancer screening.   Patient Counseling(The following topics were reviewed and/or handout was given):  -Nutrition: Stressed importance of moderation in sodium/caffeine intake, saturated fat and cholesterol, caloric balance, sufficient intake of fresh fruits, vegetables, and fiber.  -Stressed the importance of regular exercise.   -Substance Abuse: Discussed cessation/primary prevention of tobacco, alcohol, or other drug use; driving or other dangerous activities under the influence; availability of treatment for abuse.   -Injury prevention: Discussed safety belts, safety helmets, smoke detector, smoking near bedding or upholstery.   -Sexuality: Discussed sexually transmitted diseases, partner selection, use of condoms, avoidance of unintended pregnancy and contraceptive alternatives.   -Dental health: Discussed importance of regular tooth brushing, flossing, and dental visits.  -Health maintenance and immunizations reviewed. Please refer to Health maintenance section.  Return to care in 1 year for next preventative visit.     Subjective:  HPI:  He has no acute complaints  today.   He has had issues with erections recently. Difficulty getting and maintaining erections. No dysuria. No pain. No discharge.   Has had some erection issues. Not able to get an erection. No dysuria.   His stable, chronic medical conditions are outlined below:   # Hypertension - On amlodipine 10mg  daily and cardura 8mg  daily - ROS: No reported chest pain or shortness of breath  # GERD - On prilosec 20mg  daily and tolerating well  # s/p liver transplant - Follows hepatology - On myfortic and prograf - On bactrim 3 times weekly  Lifestyle Diet: No specific diets or eating plans.  Exercise: No specific exercises.   Depression screen PHQ 2/9 12/06/2018  Decreased Interest 0  Down, Depressed, Hopeless 0  PHQ - 2 Score 0   There are no preventive care reminders to display for this patient.  ROS: Per HPI, otherwise a complete review of systems was negative.   PMH:  The following were reviewed and entered/updated in epic: Past Medical History:  Diagnosis Date  . Acute renal injury ([place])   . Chronic kidney disease, stage III (moderate)   . Cirrhosis of liver ([place])   . Hypertension   . Hypomagnesemia   . Hypophosphatemia   . Liver failure ([place])   . Pyorrhea   . S/P liver transplant ([place]) 11/12/2014   @ [facility], Cellcept and Prograf   Patient Active Problem List   Diagnosis Date Noted  . Erectile dysfunction 12/06/2018  . Status post liver transplant ([place]) 09/30/2015  . Liver failure ([place]) 11/02/2014  . Essential hypertension 10/29/2014  . Thrombocytopenia ([place]) 10/29/2014   Past Surgical History:  Procedure Laterality Date  . ABDOMINAL SURGERY    . LIVER TRANSPLANT  11/12/2014   [place]    Family History  Problem Relation Age of Onset  . Hypertension Brother     Medications- reviewed and updated Current Outpatient Medications  Medication Sig Dispense Refill  . amLODipine (NORVASC) 10 MG tablet Take 1 tablet (10 mg total) by mouth daily. 90 tablet 3  .  aspirin EC 81 MG tablet Take 1 tablet (81 mg total) by mouth daily. 90 tablet 3  . doxazosin (CARDURA) 8 MG tablet Take 1 tablet (8 mg total) by mouth daily. 90 tablet 3  . K Phos Mono-Sod Phos Di & Mono (VIRT-PHOS 250 NEUTRAL) 155-852-130 MG TABS Take 2 tablets by mouth daily.    . magnesium oxide (MAG-OX) 400 MG tablet Take 400 mg by mouth daily.    Marland Kitchen omeprazole (PRILOSEC) 20 MG capsule Take 20 mg by mouth daily.    Marland Kitchen sulfamethoxazole-trimethoprim (BACTRIM,SEPTRA) 400-80 MG tablet Take 1 tablet by mouth every Monday, Wednesday, and Friday.     . tacrolimus (PROGRAF) 1 MG capsule Take 4 capsules (4 mg total) by mouth 2 (two) times daily. Take 4 capsules in the morning and 4 capsules in the evening 240 capsule 0  . traMADol (ULTRAM) 50 MG tablet Take 1 tablet (50 mg total) by mouth every 6 (six) hours as needed for severe pain. 15 tablet 0  . mycophenolate (MYFORTIC) 360 MG TBEC EC tablet TAKE 2 TABLET BY MOUTH TWICE DAILY (Patient not taking: No sig reported) 360 tablet 0  . tadalafil (CIALIS) 10 MG tablet Take 1 tablet (10 mg total) by mouth daily as needed for erectile dysfunction. 30 tablet 5   No current facility-administered medications for this visit.     Allergies-reviewed and updated No Known Allergies  Social History   Socioeconomic History  . Marital status: Married    Spouse name: Not on file  . Number of children: 3  . Years of education: 80  . Highest education level: Not on file  Occupational History  . Occupation: Office manager  . Financial resource strain: Not on file  . Food insecurity    Worry: Not on file    Inability: Not on file  . Transportation needs    Medical: Not on file    Non-medical: Not on file  Tobacco Use  . Smoking status: Never Smoker  . Smokeless tobacco: Never Used  Substance and Sexual Activity  . Alcohol use: No  . Drug use: No  . Sexual activity: Not on file  Lifestyle  . Physical activity    Days per week: Not on file     Minutes per session: Not on file  . Stress: Not on file  Relationships  . Social Herbalist on phone: Not on file    Gets together: Not on file    Attends religious service: Not on file    Active member of club or organization: Not on file    Attends meetings of clubs or organizations: Not on file    Relationship status: Not on file  Other Topics Concern  . Not on file  Social History Narrative   Fun: Drag racing         Objective:  Physical Exam: BP 131/79   Pulse 66   Temp 98.7 F (37.1 C)   Ht 5\' 5"  (1.651 m)   Wt 168 lb (76.2 kg)   SpO2 99%   BMI 27.96 kg/m   Body mass index is 27.96 kg/m. Wt Readings from Last  3 Encounters:  12/06/18 168 lb (76.2 kg)  06/22/17 168 lb 12.8 oz (76.6 kg)  02/05/17 174 lb 3.2 oz (79 kg)   Gen: NAD, resting comfortably HEENT: TMs normal bilaterally. OP clear. No thyromegaly noted.  CV: RRR with no murmurs appreciated Pulm: NWOB, CTAB with no crackles, wheezes, or rhonchi GI: Normal bowel sounds present. Soft, Nontender, Nondistended. MSK: no edema, cyanosis, or clubbing noted Skin: warm, dry Neuro: CN2-12 grossly intact. Strength 5/5 in upper and lower extremities. Reflexes symmetric and intact bilaterally.  Psych: Normal affect and thought content     Caleb M. [name], MD 12/06/2018 3:33 PM

## 2018-12-06 NOTE — Assessment & Plan Note (Signed)
Check CBC 

## 2018-12-06 NOTE — Assessment & Plan Note (Signed)
Trial cialis. Discussed potential side effects. Will need urology referral if no improvement.

## 2018-12-07 LAB — COMPREHENSIVE METABOLIC PANEL
ALT: 27 U/L (ref 0–53)
AST: 24 U/L (ref 0–37)
Albumin: 4.3 g/dL (ref 3.5–5.2)
Alkaline Phosphatase: 95 U/L (ref 39–117)
BUN: 24 mg/dL — ABNORMAL HIGH (ref 6–23)
CO2: 25 mEq/L (ref 19–32)
Calcium: 9.5 mg/dL (ref 8.4–10.5)
Chloride: 106 mEq/L (ref 96–112)
Creatinine, Ser: 1.66 mg/dL — ABNORMAL HIGH (ref 0.40–1.50)
GFR: 50.55 mL/min — ABNORMAL LOW (ref >60.00)
Glucose, Bld: 94 mg/dL (ref 70–99)
Potassium: 4.2 mEq/L (ref 3.5–5.1)
Sodium: 140 mEq/L (ref 135–145)
Total Bilirubin: 0.5 mg/dL (ref 0.2–1.2)
Total Protein: 6.7 g/dL (ref 6.0–8.3)

## 2018-12-07 LAB — CBC
HCT: 38.1 % — ABNORMAL LOW (ref 39.0–52.0)
Hemoglobin: 12.5 g/dL — ABNORMAL LOW (ref 13.0–17.0)
MCHC: 32.8 g/dL (ref 30.0–36.0)
MCV: 90.7 fl (ref 78.0–100.0)
Platelets: 136 10*3/uL — ABNORMAL LOW (ref 150.0–400.0)
RBC: 4.2 Mil/uL — ABNORMAL LOW (ref 4.22–5.81)
RDW: 13.3 % (ref 11.5–15.5)
WBC: 4.8 10*3/uL (ref 4.0–10.5)

## 2018-12-07 LAB — LIPID PANEL
Cholesterol: 124 mg/dL (ref 0–200)
HDL: 30.7 mg/dL — ABNORMAL LOW (ref >39.00)
LDL Cholesterol: 71 mg/dL (ref 0–99)
NonHDL: 92.9
Total CHOL/HDL Ratio: 4
Triglycerides: 110 mg/dL (ref 0.0–149.0)
VLDL: 22 mg/dL (ref 0.0–40.0)

## 2018-12-07 LAB — TSH: TSH: 1.5 u[IU]/mL (ref 0.35–4.50)

## 2018-12-07 LAB — PSA: PSA: 2.23 ng/mL (ref 0.10–4.00)

## 2018-12-08 NOTE — Progress Notes (Signed)
Please inform patient of the following:  Blood work is all STABLE. Would like for him to continue with his current medications and treatment plan and we can recheck in a year or so.  Bruce Conner. [name], MD 12/08/2018 1:25 PM

## 2019-05-03 ENCOUNTER — Telehealth: Payer: Self-pay | Admitting: Family Medicine

## 2019-05-03 NOTE — Telephone Encounter (Signed)
MEDICATION: Pt needs all prescriptions sent to [facility]:  Comments: Please advise  **Let patient know to contact pharmacy at the end of the day to make sure medication is ready. **  ** Please notify patient to allow 48-72 hours to process**  **Encourage patient to contact the pharmacy for refills or they can request refills through [facility]**

## 2019-05-03 NOTE — Telephone Encounter (Signed)
Patient will schedule a appt.

## 2019-05-05 ENCOUNTER — Other Ambulatory Visit: Payer: Self-pay

## 2019-05-08 ENCOUNTER — Other Ambulatory Visit: Payer: Self-pay

## 2019-05-08 ENCOUNTER — Ambulatory Visit (INDEPENDENT_AMBULATORY_CARE_PROVIDER_SITE_OTHER): Payer: 59 | Admitting: Family Medicine

## 2019-05-08 ENCOUNTER — Encounter: Payer: Self-pay | Admitting: Family Medicine

## 2019-05-08 VITALS — BP 132/74 | HR 70 | Temp 98.3°F | Ht 65.0 in | Wt 179.6 lb

## 2019-05-08 DIAGNOSIS — N529 Male erectile dysfunction, unspecified: Secondary | ICD-10-CM | POA: Diagnosis not present

## 2019-05-08 DIAGNOSIS — I10 Essential (primary) hypertension: Secondary | ICD-10-CM | POA: Diagnosis not present

## 2019-05-08 MED ORDER — AMLODIPINE BESYLATE 10 MG PO TABS: 10.0000 mg | ORAL_TABLET | Freq: Every day | ORAL | 3 refills | Status: DC

## 2019-05-08 MED ORDER — DOXAZOSIN MESYLATE 8 MG PO TABS: 8.0000 mg | ORAL_TABLET | Freq: Every day | ORAL | 3 refills | Status: DC

## 2019-05-08 MED ORDER — TADALAFIL 10 MG PO TABS: 10.0000 mg | ORAL_TABLET | Freq: Every day | ORAL | 5 refills | Status: DC | PRN

## 2019-05-08 NOTE — Patient Instructions (Signed)
It was very nice to see you today!  I will refill your medications today.  Please come back in about 6 months for your annual checkup with blood work, or sooner if needed.  Take care, Dr [name]  Please try these tips to maintain a healthy lifestyle:   Eat at least 3 REAL meals and 1-2 snacks per day.  Aim for no more than 5 hours between eating.  If you eat breakfast, please do so within one hour of getting up.    Each meal should contain half fruits/vegetables, one quarter protein, and one quarter carbs (no bigger than a computer mouse)   Cut down on sweet beverages. This includes juice, soda, and sweet tea.     Drink at least 1 glass of water with each meal and aim for at least 8 glasses per day   Exercise at least 150 minutes every week.

## 2019-05-08 NOTE — Progress Notes (Signed)
   Bruce Conner is a 64 y.o. male who presents today for an office visit.  Assessment/Plan:  Chronic Problems Addressed Today: Essential hypertension At goal.  Continue Norvasc 10 mg daily and Cardura 8 mg daily.  Erectile dysfunction Stable.  Continue Cialis as needed daily.      Subjective:  HPI:  See A/p.         Objective:  Physical Exam: BP 132/74 (BP Location: Left Arm, Patient Position: Sitting, Cuff Size: Normal)   Pulse 70   Temp 98.3 F (36.8 C) (Temporal)   Ht 5\' 5"  (1.651 m)   Wt 179 lb 9.6 oz (81.5 kg)   SpO2 98%   BMI 29.89 kg/m   Gen: No acute distress, resting comfortably CV: Regular rate and rhythm with no murmurs appreciated Pulm: Normal work of breathing, clear to auscultation bilaterally with no crackles, wheezes, or rhonchi Neuro: Grossly normal, moves all extremities Psych: Normal affect and thought content      Umer Harig M. [name], MD 05/08/2019 1:23 PM

## 2019-05-08 NOTE — Assessment & Plan Note (Signed)
Stable.  Continue Cialis as needed daily.

## 2019-05-08 NOTE — Assessment & Plan Note (Signed)
At goal.  Continue Norvasc 10 mg daily and Cardura 8 mg daily.

## 2019-05-10 ENCOUNTER — Other Ambulatory Visit: Payer: Self-pay | Admitting: Nurse Practitioner

## 2019-05-10 DIAGNOSIS — Z4823 Encounter for aftercare following liver transplant: Secondary | ICD-10-CM

## 2019-05-12 ENCOUNTER — Ambulatory Visit
Admission: RE | Admit: 2019-05-12 | Discharge: 2019-05-12 | Disposition: A | Discharge status: Home / Self Care | Payer: Medicare Other | Source: Ambulatory Visit | Attending: Nurse Practitioner | Admitting: Nurse Practitioner

## 2019-05-12 DIAGNOSIS — Z4823 Encounter for aftercare following liver transplant: Secondary | ICD-10-CM

## 2019-05-15 ENCOUNTER — Other Ambulatory Visit: Payer: Self-pay

## 2019-05-15 ENCOUNTER — Telehealth: Payer: Self-pay | Admitting: Family Medicine

## 2019-05-15 DIAGNOSIS — Z1211 Encounter for screening for malignant neoplasm of colon: Secondary | ICD-10-CM

## 2019-05-15 NOTE — Telephone Encounter (Signed)
Ok with me. Please place any necessary orders. 

## 2019-05-15 NOTE — Telephone Encounter (Signed)
Order placed

## 2019-05-15 NOTE — Telephone Encounter (Signed)
Patient is calling in this morning saying he spoke with his liver doctor and was told to have Dr.Parker put in a referral to have a colonoscopy done.

## 2019-05-17 ENCOUNTER — Encounter: Payer: Self-pay | Admitting: Gastroenterology

## 2019-06-13 ENCOUNTER — Ambulatory Visit (AMBULATORY_SURGERY_CENTER): Payer: Self-pay

## 2019-06-13 ENCOUNTER — Other Ambulatory Visit: Payer: Self-pay

## 2019-06-13 VITALS — Ht 65.0 in | Wt 179.0 lb

## 2019-06-13 DIAGNOSIS — Z1211 Encounter for screening for malignant neoplasm of colon: Secondary | ICD-10-CM

## 2019-06-13 NOTE — Progress Notes (Signed)
No egg or soy allergy known to patient  No issues with past sedation with any surgeries  or procedures, no intubation problems  No diet pills per patient No home 02 use per patient  No blood thinners per patient  Pt denies issues with constipation  No A fib or A flutter  EMMI video sent to pt's e mail   Pt to complete covid vaccines on 06/26/19  Due to the COVID-19 pandemic we are asking patients to follow these guidelines. Please only bring one care partner. Please be aware that your care partner may wait in the car in the parking lot or if they feel like they will be too hot to wait in the car, they may wait in the lobby on the 4th floor. All care partners are required to wear a mask the entire time (we do not have any that we can provide them), they need to practice social distancing, and we will do a Covid check for all patient's and care partners when you arrive. Also we will check their temperature and your temperature. If the care partner waits in their car they need to stay in the parking lot the entire time and we will call them on their cell phone when the patient is ready for discharge so they can bring the car to the front of the building. Also all patient's will need to wear a mask into building.

## 2019-06-27 ENCOUNTER — Encounter: Payer: Medicare Other | Admitting: Gastroenterology

## 2019-07-11 ENCOUNTER — Encounter: Payer: Self-pay | Admitting: Gastroenterology

## 2019-07-11 ENCOUNTER — Other Ambulatory Visit: Payer: Self-pay

## 2019-07-11 ENCOUNTER — Ambulatory Visit (AMBULATORY_SURGERY_CENTER): Payer: Medicare Other | Admitting: Gastroenterology

## 2019-07-11 VITALS — BP 108/72 | HR 55 | Temp 97.3°F | Resp 15 | Ht 65.0 in | Wt 179.0 lb

## 2019-07-11 DIAGNOSIS — Z1211 Encounter for screening for malignant neoplasm of colon: Secondary | ICD-10-CM

## 2019-07-11 DIAGNOSIS — D124 Benign neoplasm of descending colon: Secondary | ICD-10-CM

## 2019-07-11 DIAGNOSIS — D125 Benign neoplasm of sigmoid colon: Secondary | ICD-10-CM

## 2019-07-11 MED ORDER — SODIUM CHLORIDE 0.9 % IV SOLN: 500.0000 mL | Freq: Once | INTRAVENOUS | Status: DC

## 2019-07-11 NOTE — Op Note (Signed)
[facility] Patient Name: Bruce Conner Procedure Date: 07/11/2019 2:11 PM MRN: [id number] Endoscopist: Gerrit Heck , MD Age: 64 Referring MD:  Date of Birth: [birth year] Gender: Male Account #: [id number] Procedure:                Colonoscopy Indications:              Screening for colorectal malignant neoplasm (last                            colonoscopy was more than 10 years ago) Medicines:                Monitored Anesthesia Care Procedure:                Pre-Anesthesia Assessment:                           - Prior to the procedure, a History and Physical                            was performed, and patient medications and                            allergies were reviewed. The patient's tolerance of                            previous anesthesia was also reviewed. The risks                            and benefits of the procedure and the sedation                            options and risks were discussed with the patient.                            All questions were answered, and informed consent                            was obtained. Prior Anticoagulants: The patient has                            taken no previous anticoagulant or antiplatelet                            agents. ASA Grade Assessment: III - A patient with                            severe systemic disease. After reviewing the risks                            and benefits, the patient was deemed in                            satisfactory condition to undergo the procedure.  After obtaining informed consent, the colonoscope                            was passed under direct vision. Throughout the                            procedure, the patient's blood pressure, pulse, and                            oxygen saturations were monitored continuously. The                            Colonoscope was introduced through the anus and                            advanced to the the  cecum, identified by                            appendiceal orifice and ileocecal valve. The                            colonoscopy was performed without difficulty. The                            patient tolerated the procedure well. The quality                            of the bowel preparation was good. The ileocecal                            valve, appendiceal orifice, and rectum were                            photographed. Scope In: 2:23:19 PM Scope Out: 2:38:21 PM Scope Withdrawal Time: 0 hours 9 minutes 54 seconds  Total Procedure Duration: 0 hours 15 minutes 2 seconds  Findings:                 The perianal and digital rectal examinations were                            normal.                           Two sessile polyps were found in the sigmoid colon                            and descending colon. The polyps were 4 to 5 mm in                            size. These polyps were removed with a cold snare.                            Resection and retrieval were complete. Estimated  blood loss was minimal.                           The exam was otherwise normal throughout the                            remainder of the colon.                           The retroflexed view of the distal rectum and anal                            verge was normal and showed no anal or rectal                            abnormalities. Complications:            No immediate complications. Estimated Blood Loss:     Estimated blood loss was minimal. Impression:               - Two 4 to 5 mm polyps in the sigmoid colon and in                            the descending colon, removed with a cold snare.                            Resected and retrieved.                           - The distal rectum and anal verge are normal on                            retroflexion view. Recommendation:           - Patient has a contact number available for                             emergencies. The signs and symptoms of potential                            delayed complications were discussed with the                            patient. Return to normal activities tomorrow.                            Written discharge instructions were provided to the                            patient.                           - Resume previous diet.                           - Continue present medications.                           -  Await pathology results.                           - Repeat colonoscopy for surveillance based on                            pathology results.                           - Return to GI office PRN. Gerrit Heck, MD 07/11/2019 2:41:45 PM

## 2019-07-11 NOTE — Progress Notes (Signed)
Pt Drowsy. VSS. To PACU, report to RN. No anesthetic complications noted.  

## 2019-07-11 NOTE — Patient Instructions (Signed)
Impression/Recommendationsl  Polyp handout given to patient.  Resume previous diet. Continue present medications. Await pathology results.  Repeat colonoscopy for surveillance.  Date to be  Determined after pathology results reviewed.  Return to GI office as needed.  YOU HAD AN ENDOSCOPIC PROCEDURE TODAY AT [facility]:   Refer to the procedure report that was given to you for any specific questions about what was found during the examination.  If the procedure report does not answer your questions, please call your gastroenterologist to clarify.  If you requested that your care partner not be given the details of your procedure findings, then the procedure report has been included in a sealed envelope for you to review at your convenience later.  YOU SHOULD EXPECT: Some feelings of bloating in the abdomen. Passage of more gas than usual.  Walking can help get rid of the air that was put into your GI tract during the procedure and reduce the bloating. If you had a lower endoscopy (such as a colonoscopy or flexible sigmoidoscopy) you may notice spotting of blood in your stool or on the toilet paper. If you underwent a bowel prep for your procedure, you may not have a normal bowel movement for a few days.  Please Note:  You might notice some irritation and congestion in your nose or some drainage.  This is from the oxygen used during your procedure.  There is no need for concern and it should clear up in a day or so.  SYMPTOMS TO REPORT IMMEDIATELY:   Following lower endoscopy (colonoscopy or flexible sigmoidoscopy):  Excessive amounts of blood in the stool  Significant tenderness or worsening of abdominal pains  Swelling of the abdomen that is new, acute  Fever of 100F or higher For urgent or emergent issues, a gastroenterologist can be reached at any hour by calling [phone]. Do not use MyChart messaging for urgent concerns.    DIET:  We do recommend a small meal  at first, but then you may proceed to your regular diet.  Drink plenty of fluids but you should avoid alcoholic beverages for 24 hours.  ACTIVITY:  You should plan to take it easy for the rest of today and you should NOT DRIVE or use heavy machinery until tomorrow (because of the sedation medicines used during the test).    FOLLOW UP: Our staff will call the number listed on your records 48-72 hours following your procedure to check on you and address any questions or concerns that you may have regarding the information given to you following your procedure. If we do not reach you, we will leave a message.  We will attempt to reach you two times.  During this call, we will ask if you have developed any symptoms of COVID 19. If you develop any symptoms (ie: fever, flu-like symptoms, shortness of breath, cough etc.) before then, please call [phone].  If you test positive for Covid 19 in the 2 weeks post procedure, please call and report this information to [place].    If any biopsies were taken you will be contacted by phone or by letter within the next 1-3 weeks.  Please call us at [phone] if you have not heard about the biopsies in 3 weeks.    SIGNATURES/CONFIDENTIALITY: You and/or your care partner have signed paperwork which will be entered into your electronic medical record.  These signatures attest to the fact that that the information above on your After Visit Summary has been  reviewed and is understood.  Full responsibility of the confidentiality of this discharge information lies with you and/or your care-partner. 

## 2019-07-11 NOTE — Progress Notes (Signed)
Called to room to assist during endoscopic procedure.  Patient ID and intended procedure confirmed with present staff. Received instructions for my participation in the procedure from the performing physician.  

## 2019-07-13 ENCOUNTER — Telehealth: Payer: Self-pay | Admitting: *Deleted

## 2019-07-13 NOTE — Telephone Encounter (Signed)
  Follow up Call-  Call back number 07/11/2019  Post procedure Call Back phone  # [phone]  Permission to leave phone message Yes  Some recent data might be hidden     Patient questions:  Do you have a fever, pain , or abdominal swelling? No. Pain Score  0 *  Have you tolerated food without any problems? Yes.    Have you been able to return to your normal activities? Yes.    Do you have any questions about your discharge instructions: Diet   No. Medications  No. Follow up visit  No.  Do you have questions or concerns about your Care? No.  Actions: * If pain score is 4 or above: No action needed, pain <4  1. Have you developed a fever since your procedure? NO  2.   Have you had an respiratory symptoms (SOB or cough) since your procedure? NO  3.   Have you tested positive for COVID 19 since your procedure NO  4.   Have you had any family members/close contacts diagnosed with the COVID 19 since your procedure?  NO   If yes to any of these questions please route to Joylene John, RN and Erenest Rasher, RN

## 2019-07-25 ENCOUNTER — Encounter: Payer: Self-pay | Admitting: Gastroenterology

## 2019-11-07 ENCOUNTER — Other Ambulatory Visit: Payer: Self-pay

## 2019-11-07 ENCOUNTER — Encounter: Payer: Self-pay | Admitting: Family Medicine

## 2019-11-07 ENCOUNTER — Ambulatory Visit (INDEPENDENT_AMBULATORY_CARE_PROVIDER_SITE_OTHER): Payer: Medicare Other | Admitting: Family Medicine

## 2019-11-07 VITALS — BP 144/77 | HR 85 | Temp 98.6°F | Ht 65.0 in | Wt 188.4 lb

## 2019-11-07 DIAGNOSIS — Z13220 Encounter for screening for lipoid disorders: Secondary | ICD-10-CM

## 2019-11-07 DIAGNOSIS — R351 Nocturia: Secondary | ICD-10-CM

## 2019-11-07 DIAGNOSIS — Z0000 Encounter for general adult medical examination without abnormal findings: Secondary | ICD-10-CM

## 2019-11-07 DIAGNOSIS — I10 Essential (primary) hypertension: Secondary | ICD-10-CM | POA: Diagnosis not present

## 2019-11-07 DIAGNOSIS — Z23 Encounter for immunization: Secondary | ICD-10-CM

## 2019-11-07 DIAGNOSIS — N529 Male erectile dysfunction, unspecified: Secondary | ICD-10-CM | POA: Diagnosis not present

## 2019-11-07 DIAGNOSIS — Z944 Liver transplant status: Secondary | ICD-10-CM

## 2019-11-07 NOTE — Patient Instructions (Signed)
It was very nice to see you today!  We will check blood work today.  Get your flu shot today.  I will see you back in year for your annual check up with labs.  Please come back to see me sooner if needed.  Take care, Dr [name]  Please try these tips to maintain a healthy lifestyle:   Eat at least 3 REAL meals and 1-2 snacks per day.  Aim for no more than 5 hours between eating.  If you eat breakfast, please do so within one hour of getting up.    Each meal should contain half fruits/vegetables, one quarter protein, and one quarter carbs (no bigger than a computer mouse)   Cut down on sweet beverages. This includes juice, soda, and sweet tea.     Drink at least 1 glass of water with each meal and aim for at least 8 glasses per day   Exercise at least 150 minutes every week.

## 2019-11-07 NOTE — Assessment & Plan Note (Signed)
At goal.  Continue amlodipine 10 mg daily and Cardura 8 mg daily.

## 2019-11-07 NOTE — Assessment & Plan Note (Signed)
Check CMET.  Will be following up with hepatology soon.

## 2019-11-07 NOTE — Progress Notes (Signed)
Chief Complaint:  Bruce Conner is a 64 y.o. male who presents today for a subsequent Medicare Annual Wellness Visit and to discuss management of his chronic medical problems.  Assessment/Plan:   Chronic Problems Addressed Today: Status post liver transplant ([place]) Check CMET.  Will be following up with hepatology soon.  Essential hypertension At goal.  Continue amlodipine 10 mg daily and Cardura 8 mg daily.  Erectile dysfunction .   Preventative Healthcare Health Maintenance Due  Topic Date Due  . COVID-19 Vaccine (1) Never done  . INFLUENZA VACCINE  08/20/2019   Flu Vaccine was given today.  Will check labs including CBC, CMET, TSH, lipid panel, and PSA.  Colonoscopy earlier this year was normal.  We will repeat in 7 years.  Up-to-date on pneumonia vaccine.   During the course of the visit the patient was educated and counseled about appropriate screening and preventive services including:        Fall prevention   Nutrition Physical Activity Weight Management Cognition    Subjective:  HPI:  Health Risk Assessment: Patient considers his overall health to be good. He has no difficulty performing the following: . Preparing food and eating . Bathing  . Getting dressed . Using the toilet . Shopping . Managing Finances . Moving around from place to place  He has not had any falls within the past year.   Depression screen PHQ 2/9 12/06/2018  Decreased Interest 0  Down, Depressed, Hopeless 0  PHQ - 2 Score 0   Lifestyle Factors: Diet: Watching salt. Getting plenty of fruits and vegetables.  Exercise: Limited.   Patient Care Team: Vivi Barrack, MD as PCP - General (Family Medicine) Justin Mend, MD as Consulting Physician (Nephrology) Delemos, Vianne Bulls, MD as Consulting Physician (Gastroenterology)   He has no acute complaints today.   ROS: Per HPI, otherwise a complete review of systems was negative.   PMH:  The following were reviewed  and entered/updated in epic: Past Medical History:  Diagnosis Date  . Acute renal injury ([place])   . Blood transfusion without reported diagnosis   . Chronic kidney disease, stage III (moderate) ([facility])   . Cirrhosis of liver ([place])   . GERD (gastroesophageal reflux disease)   . Hypertension   . Hypomagnesemia   . Hypophosphatemia   . Liver failure ([place])   . Pyorrhea   . S/P liver transplant ([place]) 11/12/2014   @ [facility], Cellcept and Prograf   Patient Active Problem List   Diagnosis Date Noted  . Erectile dysfunction 12/06/2018  . Status post liver transplant ([place]) 09/30/2015  . Essential hypertension 10/29/2014  . Thrombocytopenia ([place]) 10/29/2014   Past Surgical History:  Procedure Laterality Date  . ABDOMINAL SURGERY    . COLONOSCOPY     ?2009  . LIVER TRANSPLANT  11/12/2014   [facility]    Family History  Problem Relation Age of Onset  . Hypertension Brother   . Colon cancer Neg Hx   . Colon polyps Neg Hx   . Esophageal cancer Neg Hx   . Rectal cancer Neg Hx   . Stomach cancer Neg Hx     Medications- reviewed and updated Current Outpatient Medications  Medication Sig Dispense Refill  . amLODipine (NORVASC) 10 MG tablet Take 1 tablet (10 mg total) by mouth daily. 90 tablet 3  . aspirin EC 81 MG tablet Take 1 tablet (81 mg total) by mouth daily. 90 tablet 3  . doxazosin (CARDURA) 8 MG tablet Take  1 tablet (8 mg total) by mouth daily. 90 tablet 3  . K Phos Mono-Sod Phos Di & Mono (VIRT-PHOS 250 NEUTRAL) 155-852-130 MG TABS Take 2 tablets by mouth daily.    . magnesium oxide (MAG-OX) 400 MG tablet Take 400 mg by mouth daily.    . mycophenolate (CELLCEPT) 500 MG tablet     . mycophenolate (MYFORTIC) 360 MG TBEC EC tablet TAKE 2 TABLET BY MOUTH TWICE DAILY 360 tablet 0  . omeprazole (PRILOSEC) 20 MG capsule Take 20 mg by mouth daily.    Marland Kitchen sulfamethoxazole-trimethoprim (BACTRIM,SEPTRA) 400-80 MG tablet Take 1 tablet by mouth every Monday, Wednesday, and Friday.     . tacrolimus  (PROGRAF) 1 MG capsule Take 4 capsules (4 mg total) by mouth 2 (two) times daily. Take 4 capsules in the morning and 4 capsules in the evening 240 capsule 0  . tadalafil (CIALIS) 10 MG tablet Take 1 tablet (10 mg total) by mouth daily as needed for erectile dysfunction. 30 tablet 5   No current facility-administered medications for this visit.    Allergies-reviewed and updated No Known Allergies  Social History   Socioeconomic History  . Marital status: Married    Spouse name: Not on file  . Number of children: 3  . Years of education: 61  . Highest education level: Not on file  Occupational History  . Occupation: Warehouse  Tobacco Use  . Smoking status: Never Smoker  . Smokeless tobacco: Never Used  Vaping Use  . Vaping Use: Never used  Substance and Sexual Activity  . Alcohol use: No  . Drug use: No  . Sexual activity: Not on file  Other Topics Concern  . Not on file  Social History Narrative   Fun: Designer, fashion/clothing    Social Determinants of Health   Financial Resource Strain:   . Difficulty of Paying Living Expenses: Not on file  Food Insecurity:   . Worried About Charity fundraiser in the Last Year: Not on file  . Ran Out of Food in the Last Year: Not on file  Transportation Needs:   . Lack of Transportation (Medical): Not on file  . Lack of Transportation (Non-Medical): Not on file  Physical Activity:   . Days of Exercise per Week: Not on file  . Minutes of Exercise per Session: Not on file  Stress:   . Feeling of Stress : Not on file  Social Connections:   . Frequency of Communication with Friends and Family: Not on file  . Frequency of Social Gatherings with Friends and Family: Not on file  . Attends Religious Services: Not on file  . Active Member of Clubs or Organizations: Not on file  . Attends Archivist Meetings: Not on file  . Marital Status: Not on file         Objective/Observations  Physical Exam: BP (!) 144/77   Pulse 85   Temp  98.6 F (37 C) (Temporal)   Ht 5\' 5"  (1.651 m)   Wt 188 lb 6.4 oz (85.5 kg)   SpO2 99%   BMI 31.35 kg/m  Gen: NAD, resting comfortably HEENT: TMs normal bilaterally. OP clear. No thyromegaly noted.  CV: RRR with no murmurs appreciated Pulm: NWOB, CTAB with no crackles, wheezes, or rhonchi GI: Normal bowel sounds present. Soft, Nontender, Nondistended. MSK: no edema, cyanosis, or clubbing noted Skin: warm, dry Neuro: CN2-12 grossly intact. Strength 5/5 in upper and lower extremities. Reflexes symmetric and intact bilaterally. Normal minicog with  3/3 delayed word recall. Psych: Normal affect and thought content  No results found for this or any previous visit (from the past 24 hour(s)).      Algis Greenhouse. [name], MD 11/07/2019 1:59 PM

## 2019-11-08 LAB — CBC
HCT: 37.3 % — ABNORMAL LOW (ref 38.5–50.0)
Hemoglobin: 11.8 g/dL — ABNORMAL LOW (ref 13.2–17.1)
MCH: 27.8 pg (ref 27.0–33.0)
MCHC: 31.6 g/dL — ABNORMAL LOW (ref 32.0–36.0)
MCV: 87.8 fL (ref 80.0–100.0)
MPV: 12.8 fL — ABNORMAL HIGH (ref 7.5–12.5)
Platelets: 168 10*3/uL (ref 140–400)
RBC: 4.25 10*6/uL (ref 4.20–5.80)
RDW: 13.1 % (ref 11.0–15.0)
WBC: 4.8 10*3/uL (ref 3.8–10.8)

## 2019-11-08 LAB — LIPID PANEL
Cholesterol: 195 mg/dL (ref <200)
HDL: 35 mg/dL — ABNORMAL LOW (ref >=40)
LDL Cholesterol (Calc): 135 mg/dL (calc) — ABNORMAL HIGH
Non-HDL Cholesterol (Calc): 160 mg/dL (calc) — ABNORMAL HIGH (ref <130)
Total CHOL/HDL Ratio: 5.6 (calc) — ABNORMAL HIGH (ref <5.0)
Triglycerides: 123 mg/dL (ref <150)

## 2019-11-08 LAB — COMPREHENSIVE METABOLIC PANEL
AG Ratio: 1.6 (calc) (ref 1.0–2.5)
ALT: 13 U/L (ref 9–46)
AST: 17 U/L (ref 10–35)
Albumin: 4.2 g/dL (ref 3.6–5.1)
Alkaline phosphatase (APISO): 91 U/L (ref 35–144)
BUN/Creatinine Ratio: 10 (calc) (ref 6–22)
BUN: 15 mg/dL (ref 7–25)
CO2: 27 mmol/L (ref 20–32)
Calcium: 9.2 mg/dL (ref 8.6–10.3)
Chloride: 105 mmol/L (ref 98–110)
Creat: 1.52 mg/dL — ABNORMAL HIGH (ref 0.70–1.25)
Globulin: 2.7 g/dL (calc) (ref 1.9–3.7)
Glucose, Bld: 96 mg/dL (ref 65–99)
Potassium: 3.5 mmol/L (ref 3.5–5.3)
Sodium: 140 mmol/L (ref 135–146)
Total Bilirubin: 0.6 mg/dL (ref 0.2–1.2)
Total Protein: 6.9 g/dL (ref 6.1–8.1)

## 2019-11-08 LAB — TSH: TSH: 1.92 mIU/L (ref 0.40–4.50)

## 2019-11-08 LAB — PSA: PSA: 1.86 ng/mL (ref <=4.0)

## 2019-11-08 NOTE — Progress Notes (Signed)
Please inform patient of the following:  Labs are all STABLE. Would like for him to keep up the good work and we can recheck in year.

## 2019-11-23 ENCOUNTER — Other Ambulatory Visit: Payer: Self-pay | Admitting: *Deleted

## 2019-11-30 ENCOUNTER — Other Ambulatory Visit: Payer: Self-pay | Admitting: *Deleted

## 2019-11-30 MED ORDER — AMLODIPINE BESYLATE 10 MG PO TABS
10.0000 mg | ORAL_TABLET | Freq: Every day | ORAL | 3 refills | Status: DC
Start: 2019-11-30 — End: 2020-07-15

## 2019-12-30 ENCOUNTER — Other Ambulatory Visit: Payer: Self-pay | Admitting: Family Medicine

## 2020-01-23 ENCOUNTER — Telehealth (INDEPENDENT_AMBULATORY_CARE_PROVIDER_SITE_OTHER): Payer: Medicare HMO | Admitting: Family Medicine

## 2020-01-23 DIAGNOSIS — R0981 Nasal congestion: Secondary | ICD-10-CM

## 2020-01-23 NOTE — Progress Notes (Signed)
Virtual Visit via Telephone Note  I connected with Bruce Conner on 01/23/20 at  3:20 PM EST by telephone and verified that I am speaking with the correct person using two identifiers.   I discussed the limitations, risks, security and privacy concerns of performing an evaluation and management service by telephone and the availability of in person appointments. I also discussed with the patient that there may be a patient responsible charge related to this service. The patient expressed understanding and agreed to proceed.  Location patient: home, [place] Location provider: work or home office Participants present for the call: patient, provider Patient did not have a visit with me in the prior 7 days to address this/these issue(s).   History of Present Illness:  Acute telemedicine visit for for nasal: -Onset: about 2 weeks -Symptoms include: nasal congestion, nose gets stopped up and a cough occasionally - not getting worse but some nasal congestion and rare cough has lingered -Denies: fevers, sob, cp, sinus pain, sinus pain or discomfort -Has tried:nothing -Pertinent past medical history: hx of remote liver transplant - reports was told can not take any antibiotics, but is on bactrim daily -Pertinent medication allergies: nkda -COVID-19 vaccine status: fully vaccinated   Observations/Objective: Patient sounds cheerful and well on the phone. I do not appreciate any SOB. Speech and thought processing are grossly intact. Patient reported vitals:  Assessment and Plan:  Nasal sinus congestion  -we discussed possible serious and likely etiologies, options for evaluation and workup, limitations of telemedicine visit vs in person visit, treatment, treatment risks and precautions. Pt prefers to treat via telemedicine empirically rather than in person at this moment.  Given the mild nature of his symptoms and duration, query recovering viral upper respiratory illness, allergic rhinosinusitis  versus other.  It seems likely improving, no severe symptoms. Opted for trial of nasal saline twice daily and close follow-up to ensure symptoms are completely resolving given his underlying health condition. Scheduled follow up with PCP offered:   Sent message to schedulers to assist and advised patient to contact PCP office to schedule if does not receive call back in next 24 hours. Advised to seek prompt in person care if worsening, new symptoms arise, or if is not improving with treatment. Advised of options for inperson care in case PCP office not available. Did let the patient know that I only do telemedicine shifts for [facility] on Tuesdays and Thursdays and advised a follow up visit with PCP or at an [facility] if has further questions or concerns.   Follow Up Instructions:  I did not refer this patient for an OV with me in the next 24 hours for this/these issue(s).  I discussed the assessment and treatment plan with the patient. The patient was provided an opportunity to ask questions and all were answered. The patient agreed with the plan and demonstrated an understanding of the instructions.   I spent 12 minutes on the date of this visit in the care of this patient. See summary of tasks completed to properly care for this patient in the detailed notes above which often include previsit review of recent office visit notes, review of PMH, medications, allergies, evaluation of the patient and ordering and instructing patient on testing and care options.     Terressa Koyanagi, DO

## 2020-01-23 NOTE — Patient Instructions (Signed)
-  Try nasal saline twice daily for the residual nasal congestion  -Follow-up with your doctor later this week or the beginning of next week to ensure you are improving and symptoms are resolving.  I hope you are feeling better soon!  Seek in person care promptly if your symptoms worsen, new concerns arise or you are not improving with treatment.  It was nice to meet you today. I help [facility] out with telemedicine visits on Tuesdays and Thursdays and am available for visits on those days. If you have any concerns or questions following this visit please schedule a follow up visit with your Primary Care doctor or seek care at a local urgent care clinic to avoid delays in care.

## 2020-01-26 DIAGNOSIS — I129 Hypertensive chronic kidney disease with stage 1 through stage 4 chronic kidney disease, or unspecified chronic kidney disease: Secondary | ICD-10-CM | POA: Diagnosis not present

## 2020-01-26 DIAGNOSIS — N183 Chronic kidney disease, stage 3 unspecified: Secondary | ICD-10-CM | POA: Diagnosis not present

## 2020-01-26 DIAGNOSIS — Z944 Liver transplant status: Secondary | ICD-10-CM | POA: Diagnosis not present

## 2020-01-26 LAB — BASIC METABOLIC PANEL
BUN: 16 (ref 4–21)
CO2: 23 — AB (ref 13–22)
Chloride: 102 (ref 99–108)
Creatinine: 1.7 — AB (ref 0.6–1.3)
Glucose: 109
Potassium: 4.4 (ref 3.4–5.3)
Sodium: 134 — AB (ref 137–147)

## 2020-01-26 LAB — COMPREHENSIVE METABOLIC PANEL
Albumin: 4.1 (ref 3.5–5.0)
Calcium: 9.3 (ref 8.7–10.7)
GFR calc Af Amer: 47
GFR calc non Af Amer: 41

## 2020-01-29 DIAGNOSIS — Z1152 Encounter for screening for COVID-19: Secondary | ICD-10-CM | POA: Diagnosis not present

## 2020-02-02 DIAGNOSIS — Z1152 Encounter for screening for COVID-19: Secondary | ICD-10-CM | POA: Diagnosis not present

## 2020-02-06 ENCOUNTER — Telehealth (INDEPENDENT_AMBULATORY_CARE_PROVIDER_SITE_OTHER): Payer: Medicare HMO | Admitting: Family Medicine

## 2020-02-06 DIAGNOSIS — R059 Cough, unspecified: Secondary | ICD-10-CM | POA: Diagnosis not present

## 2020-02-06 DIAGNOSIS — Z8616 Personal history of COVID-19: Secondary | ICD-10-CM

## 2020-02-06 MED ORDER — AMOXICILLIN-POT CLAVULANATE 875-125 MG PO TABS: 1.0000 | ORAL_TABLET | Freq: Two times a day (BID) | ORAL | 0 refills | Status: DC

## 2020-02-06 MED ORDER — BENZONATATE 100 MG PO CAPS: 100.0000 mg | ORAL_CAPSULE | Freq: Three times a day (TID) | ORAL | 0 refills | Status: DC | PRN

## 2020-02-06 NOTE — Progress Notes (Signed)
Virtual Visit via Video Note  I connected with Myrick  on 02/06/20 at 12:40 PM EST by a video enabled telemedicine application and verified that I am speaking with the correct person using two identifiers.  Location patient: home, [place] Location provider:work or home office Persons participating in the virtual visit: patient, provider  I discussed the limitations of evaluation and management by telemedicine and the availability of in person appointments. The patient expressed understanding and agreed to proceed.   HPI:  Acute telemedicine visit for COVID19: -Onset: 01/23/20; positive test on 02/02/20 -Symptoms include: started out feeling pretty sick, has improved some, but has persistent cough, sometimes coughs up white mucus -Denies: fevers, NVD currently (diarrhea initially), CP, SOB, wheezing, inability to get out of bed/eat/drink -Has tried: none -Pertinent past medical history: liver transplant patient - reports they told him not to take any medications -Pertinent medication allergies: nkda -COVID-19 vaccine status: fully vaccinated  ROS: See pertinent positives and negatives per HPI.  Past Medical History:  Diagnosis Date  . Acute renal injury ([place])   . Blood transfusion without reported diagnosis   . Chronic kidney disease, stage III (moderate) (HCC)   . Cirrhosis of liver ([place])   . GERD (gastroesophageal reflux disease)   . Hypertension   . Hypomagnesemia   . Hypophosphatemia   . Liver failure ([place])   . Pyorrhea   . S/P liver transplant ([place]) 11/12/2014   @ [facility], Cellcept and Prograf    Past Surgical History:  Procedure Laterality Date  . ABDOMINAL SURGERY    . COLONOSCOPY     ?2009  . LIVER TRANSPLANT  11/12/2014   [facility]     Current Outpatient Medications:  .  amoxicillin-clavulanate (AUGMENTIN) 875-125 MG tablet, Take 1 tablet by mouth 2 (two) times daily., Disp: 14 tablet, Rfl: 0 .  benzonatate (TESSALON PERLES) 100 MG capsule, Take 1 capsule (100 mg total) by  mouth 3 (three) times daily as needed., Disp: 20 capsule, Rfl: 0 .  amLODipine (NORVASC) 10 MG tablet, Take 1 tablet (10 mg total) by mouth daily., Disp: 90 tablet, Rfl: 3 .  aspirin EC 81 MG tablet, Take 1 tablet (81 mg total) by mouth daily., Disp: 90 tablet, Rfl: 3 .  doxazosin (CARDURA) 8 MG tablet, Take 1 tablet (8 mg total) by mouth daily., Disp: 90 tablet, Rfl: 3 .  K Phos Mono-Sod Phos Di & Mono (VIRT-PHOS 250 NEUTRAL) 155-852-130 MG TABS, Take 2 tablets by mouth daily., Disp: , Rfl:  .  magnesium oxide (MAG-OX) 400 MG tablet, Take 400 mg by mouth daily., Disp: , Rfl:  .  mycophenolate (CELLCEPT) 500 MG tablet, , Disp: , Rfl:  .  mycophenolate (MYFORTIC) 360 MG TBEC EC tablet, TAKE 2 TABLET BY MOUTH TWICE DAILY, Disp: 360 tablet, Rfl: 0 .  omeprazole (PRILOSEC) 20 MG capsule, Take 20 mg by mouth daily., Disp: , Rfl:  .  sulfamethoxazole-trimethoprim (BACTRIM,SEPTRA) 400-80 MG tablet, Take 1 tablet by mouth every Monday, Wednesday, and Friday. , Disp: , Rfl:  .  tacrolimus (PROGRAF) 1 MG capsule, Take 4 capsules (4 mg total) by mouth 2 (two) times daily. Take 4 capsules in the morning and 4 capsules in the evening, Disp: 240 capsule, Rfl: 0 .  tadalafil (CIALIS) 10 MG tablet, TAKE 1 TABLET(10 MG) BY MOUTH DAILY AS NEEDED FOR ERECTILE DYSFUNCTION, Disp: 30 tablet, Rfl: 5  EXAM:  VITALS per patient if applicable:  GENERAL: alert, oriented, appears well and in no acute distress  HEENT: atraumatic, conjunttiva  clear, no obvious abnormalities on inspection of external nose and ears  NECK: normal movements of the head and neck  LUNGS: on inspection no signs of respiratory distress, breathing rate appears normal, no obvious gross SOB, gasping or wheezing  CV: no obvious cyanosis  MS: moves all visible extremities without noticeable abnormality  PSYCH/NEURO: pleasant and cooperative, no obvious depression or anxiety, speech and thought processing grossly intact  ASSESSMENT AND  PLAN:  Discussed the following assessment and plan:  Cough  History of COVID-19  -we discussed possible serious and likely etiologies, options for evaluation and workup, limitations of telemedicine visit vs in person visit, treatment, treatment risks and precautions. Pt prefers to treat via telemedicine empirically rather than in person at this moment. Patient with persistent cough and some discolored sputum 2 weeks after covid infection. Denies CP, SOB, malaise, fevers. Opted for treatment with abx and cough medications - he did not want to take any medications without approval from his liver specialist, spoke with [place] at his liver clinic to confirm rxs ok for him prior to prescribing per his request.  Scheduled follow up with PCP offered: advised follow up with PCP next week. Sent message to schedulers to assist and advised patient to contact PCP office to schedule if does not receive call back in next 24 hours. Advised to seek prompt in person care if worsening, new symptoms arise, or if is not improving with treatment. Discussed options for inperson care if PCP office not available. Did let this patient know that I only do telemedicine on Tuesdays and Thursdays for [facility]. Advised to schedule follow up visit with PCP or [facility] if any further questions or concerns to avoid delays in care.   I discussed the assessment and treatment plan with the patient. The patient was provided an opportunity to ask questions and all were answered. The patient agreed with the plan and demonstrated an understanding of the instructions.     Lucretia Kern, DO

## 2020-03-14 ENCOUNTER — Encounter: Payer: Self-pay | Admitting: Family Medicine

## 2020-05-08 DIAGNOSIS — D849 Immunodeficiency, unspecified: Secondary | ICD-10-CM | POA: Diagnosis not present

## 2020-05-08 DIAGNOSIS — Z944 Liver transplant status: Secondary | ICD-10-CM | POA: Diagnosis not present

## 2020-05-09 ENCOUNTER — Other Ambulatory Visit: Payer: Self-pay | Admitting: Family Medicine

## 2020-05-09 ENCOUNTER — Other Ambulatory Visit: Payer: Self-pay | Admitting: Nurse Practitioner

## 2020-05-09 DIAGNOSIS — D849 Immunodeficiency, unspecified: Secondary | ICD-10-CM

## 2020-05-09 DIAGNOSIS — Z4823 Encounter for aftercare following liver transplant: Secondary | ICD-10-CM | POA: Diagnosis not present

## 2020-05-09 DIAGNOSIS — Z944 Liver transplant status: Secondary | ICD-10-CM

## 2020-07-15 ENCOUNTER — Other Ambulatory Visit: Payer: Self-pay | Admitting: Family Medicine

## 2020-07-26 ENCOUNTER — Other Ambulatory Visit: Payer: Self-pay | Admitting: Nurse Practitioner

## 2020-07-26 DIAGNOSIS — Z944 Liver transplant status: Secondary | ICD-10-CM

## 2020-08-05 ENCOUNTER — Ambulatory Visit
Admission: RE | Admit: 2020-08-05 | Discharge: 2020-08-05 | Disposition: A | Discharge status: Home / Self Care | Payer: Medicare HMO | Source: Ambulatory Visit | Attending: Nurse Practitioner | Admitting: Nurse Practitioner

## 2020-08-05 DIAGNOSIS — Z9049 Acquired absence of other specified parts of digestive tract: Secondary | ICD-10-CM | POA: Diagnosis not present

## 2020-08-05 DIAGNOSIS — Z944 Liver transplant status: Secondary | ICD-10-CM

## 2020-08-05 DIAGNOSIS — I81 Portal vein thrombosis: Secondary | ICD-10-CM | POA: Diagnosis not present

## 2020-11-07 DIAGNOSIS — D849 Immunodeficiency, unspecified: Secondary | ICD-10-CM | POA: Diagnosis not present

## 2020-11-07 DIAGNOSIS — Z944 Liver transplant status: Secondary | ICD-10-CM | POA: Diagnosis not present

## 2020-11-08 ENCOUNTER — Other Ambulatory Visit (HOSPITAL_BASED_OUTPATIENT_CLINIC_OR_DEPARTMENT_OTHER): Payer: Self-pay | Admitting: Nurse Practitioner

## 2020-11-08 DIAGNOSIS — Z944 Liver transplant status: Secondary | ICD-10-CM

## 2020-11-08 DIAGNOSIS — D849 Immunodeficiency, unspecified: Secondary | ICD-10-CM

## 2020-12-03 ENCOUNTER — Other Ambulatory Visit (HOSPITAL_BASED_OUTPATIENT_CLINIC_OR_DEPARTMENT_OTHER): Payer: Medicare HMO

## 2020-12-04 ENCOUNTER — Other Ambulatory Visit: Payer: Self-pay | Admitting: Family Medicine

## 2020-12-04 ENCOUNTER — Other Ambulatory Visit: Payer: Self-pay

## 2020-12-04 ENCOUNTER — Ambulatory Visit (HOSPITAL_BASED_OUTPATIENT_CLINIC_OR_DEPARTMENT_OTHER)
Admission: RE | Admit: 2020-12-04 | Discharge: 2020-12-04 | Disposition: A | Discharge status: Home / Self Care | Payer: Medicare HMO | Source: Ambulatory Visit | Attending: Nurse Practitioner | Admitting: Nurse Practitioner

## 2020-12-04 DIAGNOSIS — M85851 Other specified disorders of bone density and structure, right thigh: Secondary | ICD-10-CM | POA: Insufficient documentation

## 2020-12-04 DIAGNOSIS — Z13820 Encounter for screening for osteoporosis: Secondary | ICD-10-CM | POA: Insufficient documentation

## 2020-12-04 DIAGNOSIS — Z944 Liver transplant status: Secondary | ICD-10-CM | POA: Diagnosis not present

## 2020-12-04 DIAGNOSIS — D849 Immunodeficiency, unspecified: Secondary | ICD-10-CM | POA: Diagnosis not present

## 2021-01-27 ENCOUNTER — Other Ambulatory Visit: Payer: Self-pay

## 2021-01-27 ENCOUNTER — Encounter: Payer: Self-pay | Admitting: Family Medicine

## 2021-01-27 ENCOUNTER — Ambulatory Visit (INDEPENDENT_AMBULATORY_CARE_PROVIDER_SITE_OTHER): Payer: Medicare HMO | Admitting: Family Medicine

## 2021-01-27 VITALS — BP 158/81 | HR 75 | Temp 98.3°F | Ht 65.0 in | Wt 196.4 lb

## 2021-01-27 DIAGNOSIS — I10 Essential (primary) hypertension: Secondary | ICD-10-CM

## 2021-01-27 DIAGNOSIS — N183 Chronic kidney disease, stage 3 unspecified: Secondary | ICD-10-CM | POA: Diagnosis not present

## 2021-01-27 DIAGNOSIS — Z944 Liver transplant status: Secondary | ICD-10-CM

## 2021-01-27 DIAGNOSIS — Z13220 Encounter for screening for lipoid disorders: Secondary | ICD-10-CM

## 2021-01-27 DIAGNOSIS — D696 Thrombocytopenia, unspecified: Secondary | ICD-10-CM

## 2021-01-27 DIAGNOSIS — Z0001 Encounter for general adult medical examination with abnormal findings: Secondary | ICD-10-CM | POA: Diagnosis not present

## 2021-01-27 DIAGNOSIS — Z23 Encounter for immunization: Secondary | ICD-10-CM | POA: Diagnosis not present

## 2021-01-27 DIAGNOSIS — N529 Male erectile dysfunction, unspecified: Secondary | ICD-10-CM | POA: Diagnosis not present

## 2021-01-27 DIAGNOSIS — E669 Obesity, unspecified: Secondary | ICD-10-CM

## 2021-01-27 DIAGNOSIS — R739 Hyperglycemia, unspecified: Secondary | ICD-10-CM

## 2021-01-27 DIAGNOSIS — M8580 Other specified disorders of bone density and structure, unspecified site: Secondary | ICD-10-CM | POA: Diagnosis not present

## 2021-01-27 DIAGNOSIS — Z6832 Body mass index (BMI) 32.0-32.9, adult: Secondary | ICD-10-CM

## 2021-01-27 DIAGNOSIS — I129 Hypertensive chronic kidney disease with stage 1 through stage 4 chronic kidney disease, or unspecified chronic kidney disease: Secondary | ICD-10-CM | POA: Diagnosis not present

## 2021-01-27 LAB — CBC
HCT: 37.9 % — ABNORMAL LOW (ref 39.0–52.0)
Hemoglobin: 11.9 g/dL — ABNORMAL LOW (ref 13.0–17.0)
MCHC: 31.4 g/dL (ref 30.0–36.0)
MCV: 86.2 fl (ref 78.0–100.0)
Platelets: 157 10*3/uL (ref 150.0–400.0)
RBC: 4.4 Mil/uL (ref 4.22–5.81)
RDW: 14.9 % (ref 11.5–15.5)
WBC: 5.2 10*3/uL (ref 4.0–10.5)

## 2021-01-27 LAB — HEMOGLOBIN A1C: Hgb A1c MFr Bld: 5.4 % (ref 4.6–6.5)

## 2021-01-27 MED ORDER — AMLODIPINE BESYLATE 10 MG PO TABS: 10.0000 mg | ORAL_TABLET | Freq: Every day | ORAL | 3 refills | Status: DC

## 2021-01-27 MED ORDER — TADALAFIL 10 MG PO TABS: ORAL_TABLET | ORAL | 5 refills | Status: DC

## 2021-01-27 MED ORDER — DOXAZOSIN MESYLATE 8 MG PO TABS: 8.0000 mg | ORAL_TABLET | Freq: Every day | ORAL | 3 refills | Status: DC

## 2021-01-27 NOTE — Assessment & Plan Note (Signed)
Follows with nephrology.  We will check labs today.

## 2021-01-27 NOTE — Assessment & Plan Note (Signed)
Above goal.  Typically well controlled.  We will continue amlodipine 10 mg daily and Cardura 8 mg daily.  He will let me know if persistently elevated at home.

## 2021-01-27 NOTE — Assessment & Plan Note (Signed)
Stable.  Cialis refilled.

## 2021-01-27 NOTE — Assessment & Plan Note (Signed)
Check CBC 

## 2021-01-27 NOTE — Assessment & Plan Note (Signed)
Check labs.  Follows with ophthalmology.  He is on tacrolimus and mycophenolate.

## 2021-01-27 NOTE — Progress Notes (Signed)
Chief Complaint:  Bruce Conner is a 66 y.o. male who presents today for his annual comprehensive physical exam.    Assessment/Plan:  Chronic Problems Addressed Today: Status post liver transplant ([place]) Check labs.  Follows with ophthalmology.  He is on tacrolimus and mycophenolate.  Thrombocytopenia (HCC) Check CBC.   Essential hypertension Above goal.  Typically well controlled.  We will continue amlodipine 10 mg daily and Cardura 8 mg daily.  He will let me know if persistently elevated at home.  CKD (chronic kidney disease) stage 3, GFR 30-59 ml/min (HCC) Follows with nephrology.  We will check labs today.  Osteopenia Found by hepatology.  Recommended calcium and vitamin D supplementation.  Erectile dysfunction Stable.  Cialis refilled.  Body mass index is 32.68 kg/m. / Obese  BMI Metric Follow Up - 01/27/21 1443       BMI Metric Follow Up-Please document annually   BMI Metric Follow Up Education provided             Preventative Healthcare: Check labs.  Declined pneumonia vaccine.  Flu shot given today.  Patient Counseling(The following topics were reviewed and/or handout was given):  -Nutrition: Stressed importance of moderation in sodium/caffeine intake, saturated fat and cholesterol, caloric balance, sufficient intake of fresh fruits, vegetables, and fiber.  -Stressed the importance of regular exercise.   -Substance Abuse: Discussed cessation/primary prevention of tobacco, alcohol, or other drug use; driving or other dangerous activities under the influence; availability of treatment for abuse.   -Injury prevention: Discussed safety belts, safety helmets, smoke detector, smoking near bedding or upholstery.   -Sexuality: Discussed sexually transmitted diseases, partner selection, use of condoms, avoidance of unintended pregnancy and contraceptive alternatives.   -Dental health: Discussed importance of regular tooth brushing, flossing, and dental visits.   -Health maintenance and immunizations reviewed. Please refer to Health maintenance section.  Return to care in 1 year for next preventative visit.     Subjective:  HPI:  He has no acute complaints today. See A/p for status of chronic conditions.   Lifestyle Diet: Watching sodium. No specific diets.  Exercise: Limited. Likes to walk.   Depression screen PHQ 2/9 01/27/2021  Decreased Interest 0  Down, Depressed, Hopeless 0  PHQ - 2 Score 0    Health Maintenance Due  Topic Date Due   TETANUS/TDAP  Never done   Zoster Vaccines- Shingrix (1 of 2) Never done   COVID-19 Vaccine (3 - Pfizer risk series) 07/24/2019   Pneumonia Vaccine 59+ Years old (2 - PPSV23 if available, else PCV20) 12/06/2019     ROS: Per HPI, otherwise a complete review of systems was negative.   PMH:  The following were reviewed and entered/updated in epic: Past Medical History:  Diagnosis Date   Acute renal injury ([place])    Blood transfusion without reported diagnosis    Chronic kidney disease, stage III (moderate) ([place])    Cirrhosis of liver ([place])    GERD (gastroesophageal reflux disease)    Hypertension    Hypomagnesemia    Hypophosphatemia    Liver failure ([place])    Pyorrhea    S/P liver transplant ([place]) 11/12/2014   @ [facility], Cellcept and Prograf   Patient Active Problem List   Diagnosis Date Noted   Osteopenia 01/27/2021   CKD (chronic kidney disease) stage 3, GFR 30-59 ml/min ([place]) 01/27/2021   Erectile dysfunction 12/06/2018   Status post liver transplant ([place]) 09/30/2015   Essential hypertension 10/29/2014   Thrombocytopenia ([employer]) 10/29/2014   Past  Surgical History:  Procedure Laterality Date   ABDOMINAL SURGERY     COLONOSCOPY     ?2009   LIVER TRANSPLANT  11/12/2014   [facility]    Family History  Problem Relation Age of Onset   Hypertension Brother    Colon cancer Neg Hx    Colon polyps Neg Hx    Esophageal cancer Neg Hx    Rectal cancer Neg Hx    Stomach cancer Neg Hx      Medications- reviewed and updated Current Outpatient Medications  Medication Sig Dispense Refill   aspirin EC 81 MG tablet Take 1 tablet (81 mg total) by mouth daily. 90 tablet 3   K Phos Mono-Sod Phos Di & Mono (VIRT-PHOS 250 NEUTRAL) 155-852-130 MG TABS Take 2 tablets by mouth daily.     magnesium oxide (MAG-OX) 400 MG tablet Take 400 mg by mouth daily.     mycophenolate (CELLCEPT) 500 MG tablet      mycophenolate (MYFORTIC) 360 MG TBEC EC tablet TAKE 2 TABLET BY MOUTH TWICE DAILY 360 tablet 0   omeprazole (PRILOSEC) 20 MG capsule Take 20 mg by mouth daily.     sulfamethoxazole-trimethoprim (BACTRIM,SEPTRA) 400-80 MG tablet Take 1 tablet by mouth every Monday, Wednesday, and Friday.      tacrolimus (PROGRAF) 1 MG capsule Take 4 capsules (4 mg total) by mouth 2 (two) times daily. Take 4 capsules in the morning and 4 capsules in the evening 240 capsule 0   amLODipine (NORVASC) 10 MG tablet Take 1 tablet (10 mg total) by mouth daily. 90 tablet 3   doxazosin (CARDURA) 8 MG tablet Take 1 tablet (8 mg total) by mouth daily. 90 tablet 3   tadalafil (CIALIS) 10 MG tablet TAKE 1 TABLET(10 MG) BY MOUTH DAILY AS NEEDED FOR ERECTILE DYSFUNCTION 30 tablet 5   No current facility-administered medications for this visit.    Allergies-reviewed and updated No Known Allergies  Social History   Socioeconomic History   Marital status: Married    Spouse name: Not on file   Number of children: 3   Years of education: 12   Highest education level: Not on file  Occupational History   Occupation: Warehouse  Tobacco Use   Smoking status: Never   Smokeless tobacco: Never  Vaping Use   Vaping Use: Never used  Substance and Sexual Activity   Alcohol use: No   Drug use: No   Sexual activity: Not on file  Other Topics Concern   Not on file  Social History Narrative   Fun: Designer, fashion/clothing    Social Determinants of Health   Financial Resource Strain: Not on file  Food Insecurity: Not on file   Transportation Needs: Not on file  Physical Activity: Not on file  Stress: Not on file  Social Connections: Not on file        Objective:  Physical Exam: BP (!) 158/81    Pulse 75    Temp 98.3 F (36.8 C) (Temporal)    Ht 5\' 5"  (1.651 m)    Wt 196 lb 6.4 oz (89.1 kg)    SpO2 99%    BMI 32.68 kg/m   Body mass index is 32.68 kg/m. Wt Readings from Last 3 Encounters:  01/27/21 196 lb 6.4 oz (89.1 kg)  11/07/19 188 lb 6.4 oz (85.5 kg)  07/11/19 179 lb (81.2 kg)   Gen: NAD, resting comfortably HEENT: TMs normal bilaterally. OP clear. No thyromegaly noted.  CV: RRR with no murmurs appreciated Pulm:  NWOB, CTAB with no crackles, wheezes, or rhonchi GI: Normal bowel sounds present. Soft, Nontender, Nondistended. MSK: no edema, cyanosis, or clubbing noted Skin: warm, dry Neuro: CN2-12 grossly intact. Strength 5/5 in upper and lower extremities. Reflexes symmetric and intact bilaterally.  Psych: Normal affect and thought content     Leshea Jaggers M. [name], MD 01/27/2021 2:44 PM

## 2021-01-27 NOTE — Assessment & Plan Note (Signed)
Found by hepatology.  Recommended calcium and vitamin D supplementation.

## 2021-01-27 NOTE — Patient Instructions (Addendum)
It was very nice to see you today!  We will check blood work today.  I will refill your medications.  Please continue to work on diet and exercise.  Please take 800IU of vitamin D daily and 1200mg  of calcium.  We will see you back in year.  Come back sooner if needed.  Take care, Dr [name]  PLEASE NOTE:  If you had any lab tests please let us know if you have not heard back within a few days. You may see your results on mychart before we have a chance to review them but we will give you a call once they are reviewed by [place]. If we ordered any referrals today, please let us know if you have not heard from their office within the next week.   Please try these tips to maintain a healthy lifestyle:  Eat at least 3 REAL meals and 1-2 snacks per day.  Aim for no more than 5 hours between eating.  If you eat breakfast, please do so within one hour of getting up.   Each meal should contain half fruits/vegetables, one quarter protein, and one quarter carbs (no bigger than a computer mouse)  Cut down on sweet beverages. This includes juice, soda, and sweet tea.   Drink at least 1 glass of water with each meal and aim for at least 8 glasses per day  Exercise at least 150 minutes every week.    Preventive Care 46 Years and Older, Male Preventive care refers to lifestyle choices and visits with your health care provider that can promote health and wellness. Preventive care visits are also called wellness exams. What can I expect for my preventive care visit? Counseling During your preventive care visit, your health care provider may ask about your: Medical history, including: Past medical problems. Family medical history. History of falls. Current health, including: Emotional well-being. Home life and relationship well-being. Sexual activity. Memory and ability to understand (cognition). Lifestyle, including: Alcohol, nicotine or tobacco, and drug use. Access to firearms. Diet,  exercise, and sleep habits. Work and work Statistician. Sunscreen use. Safety issues such as seatbelt and bike helmet use. Physical exam Your health care provider will check your: Height and weight. These may be used to calculate your BMI (body mass index). BMI is a measurement that tells if you are at a healthy weight. Waist circumference. This measures the distance around your waistline. This measurement also tells if you are at a healthy weight and may help predict your risk of certain diseases, such as type 2 diabetes and high blood pressure. Heart rate and blood pressure. Body temperature. Skin for abnormal spots. What immunizations do I need? Vaccines are usually given at various ages, according to a schedule. Your health care provider will recommend vaccines for you based on your age, medical history, and lifestyle or other factors, such as travel or where you work. What tests do I need? Screening Your health care provider may recommend screening tests for certain conditions. This may include: Lipid and cholesterol levels. Diabetes screening. This is done by checking your blood sugar (glucose) after you have not eaten for a while (fasting). Hepatitis C test. Hepatitis B test. HIV (human immunodeficiency virus) test. STI (sexually transmitted infection) testing, if you are at risk. Lung cancer screening. Colorectal cancer screening. Prostate cancer screening. Abdominal aortic aneurysm (AAA) screening. You may need this if you are a current or former smoker. Talk with your health care provider about your test results, treatment options, and  if necessary, the need for more tests. Follow these instructions at home: Eating and drinking  Eat a diet that includes fresh fruits and vegetables, whole grains, lean protein, and low-fat dairy products. Limit your intake of foods with high amounts of sugar, saturated fats, and salt. Take vitamin and mineral supplements as recommended by your  health care provider. Do not drink alcohol if your health care provider tells you not to drink. If you drink alcohol: Limit how much you have to 0-2 drinks a day. Know how much alcohol is in your drink. In the [place] one drink equals one 12 oz bottle of beer (355 mL), one 5 oz glass of wine (148 mL), or one 1 oz glass of hard liquor (44 mL). Lifestyle Brush your teeth every morning and night with fluoride toothpaste. Floss one time each day. Exercise for at least 30 minutes 5 or more days each week. Do not use any products that contain nicotine or tobacco. These products include cigarettes, chewing tobacco, and vaping devices, such as e-cigarettes. If you need help quitting, ask your health care provider. Do not use drugs. If you are sexually active, practice safe sex. Use a condom or other form of protection to prevent STIs. Take aspirin only as told by your health care provider. Make sure that you understand how much to take and what form to take. Work with your health care provider to find out whether it is safe and beneficial for you to take aspirin daily. Ask your health care provider if you need to take a cholesterol-lowering medicine (statin). Find healthy ways to manage stress, such as: Meditation, yoga, or listening to music. Journaling. Talking to a trusted person. Spending time with friends and family. Safety Always wear your seat belt while driving or riding in a vehicle. Do not drive: If you have been drinking alcohol. Do not ride with someone who has been drinking. When you are tired or distracted. While texting. If you have been using any mind-altering substances or drugs. Wear a helmet and other protective equipment during sports activities. If you have firearms in your house, make sure you follow all gun safety procedures. Minimize exposure to UV radiation to reduce your risk of skin cancer. What's next? Visit your health care provider once a year for an annual wellness  visit. Ask your health care provider how often you should have your eyes and teeth checked. Stay up to date on all vaccines. This information is not intended to replace advice given to you by your health care provider. Make sure you discuss any questions you have with your health care provider. Document Revised: 07/03/2020 Document Reviewed: 07/03/2020 Elsevier Patient Education  [place].

## 2021-01-28 LAB — COMPREHENSIVE METABOLIC PANEL
ALT: 17 U/L (ref 0–53)
AST: 21 U/L (ref 0–37)
Albumin: 4.3 g/dL (ref 3.5–5.2)
Alkaline Phosphatase: 84 U/L (ref 39–117)
BUN: 16 mg/dL (ref 6–23)
CO2: 24 mEq/L (ref 19–32)
Calcium: 9.4 mg/dL (ref 8.4–10.5)
Chloride: 104 mEq/L (ref 96–112)
Creatinine, Ser: 1.42 mg/dL (ref 0.40–1.50)
GFR: 51.25 mL/min — ABNORMAL LOW (ref >60.00)
Glucose, Bld: 93 mg/dL (ref 70–99)
Potassium: 3.4 mEq/L — ABNORMAL LOW (ref 3.5–5.1)
Sodium: 138 mEq/L (ref 135–145)
Total Bilirubin: 0.6 mg/dL (ref 0.2–1.2)
Total Protein: 7.3 g/dL (ref 6.0–8.3)

## 2021-01-28 LAB — LIPID PANEL
Cholesterol: 209 mg/dL — ABNORMAL HIGH (ref 0–200)
HDL: 34.9 mg/dL — ABNORMAL LOW (ref >39.00)
LDL Cholesterol: 148 mg/dL — ABNORMAL HIGH (ref 0–99)
NonHDL: 174.26
Total CHOL/HDL Ratio: 6
Triglycerides: 133 mg/dL (ref 0.0–149.0)
VLDL: 26.6 mg/dL (ref 0.0–40.0)

## 2021-01-28 LAB — TSH: TSH: 2.72 u[IU]/mL (ref 0.35–5.50)

## 2021-01-29 ENCOUNTER — Other Ambulatory Visit: Payer: Self-pay | Admitting: *Deleted

## 2021-01-29 ENCOUNTER — Encounter: Payer: Self-pay | Admitting: Family Medicine

## 2021-01-29 DIAGNOSIS — E785 Hyperlipidemia, unspecified: Secondary | ICD-10-CM | POA: Insufficient documentation

## 2021-01-29 MED ORDER — ATORVASTATIN CALCIUM 40 MG PO TABS: 40.0000 mg | ORAL_TABLET | Freq: Every day | ORAL | 1 refills | Status: DC

## 2021-01-29 NOTE — Progress Notes (Signed)
Please inform patient of the following:  His cholesterol is elevated. Recommend starting lipitor 40 mg daily to improve numbers and lower risk of heart attack and stroke.  Regardless he should continue working on diet and exercise.  We can recheck in 6 to 12 months.  He is slightly anemic but this is stable likely due to his kidney and liver issues.   Everything else is normal and we can recheck in a year.  Bruce Conner. [name], MD 01/29/2021 8:49 AM

## 2021-02-05 ENCOUNTER — Telehealth: Payer: Self-pay | Admitting: *Deleted

## 2021-02-05 NOTE — Telephone Encounter (Signed)
PA Denied today Pharmacy requesting change to preferred alternative  Please advise

## 2021-02-05 NOTE — Telephone Encounter (Signed)
Key: [id number] - PA Case ID: [id number] - Rx #: [id number] Status Sent to Plan today Drug Tadalafil 10MG  tablets Waiting for determination

## 2021-02-06 NOTE — Telephone Encounter (Signed)
Spoke with patient, patient aware will call insurance for coverage  Will call back with Rx information

## 2021-02-06 NOTE — Telephone Encounter (Signed)
Please let patient know. He can check with insurance to see if there is anything they will cover.  Bruce Conner. [name], MD 02/06/2021 7:53 AM

## 2021-02-14 DIAGNOSIS — N183 Chronic kidney disease, stage 3 unspecified: Secondary | ICD-10-CM | POA: Diagnosis not present

## 2021-02-26 IMAGING — US US ABDOMEN COMPLETE
1 series · 14 of 25 positions shown · non-contrast
Comparison: 06/20/2018

CLINICAL DATA: Post liver transplant, currently asymptomatic

EXAM:
COMPLETE ABDOMINAL ULTRASOUND

[Series 1: us abdomen complete · 0.20mm/px · 14 of 106 slices shown]
[im 1/106]
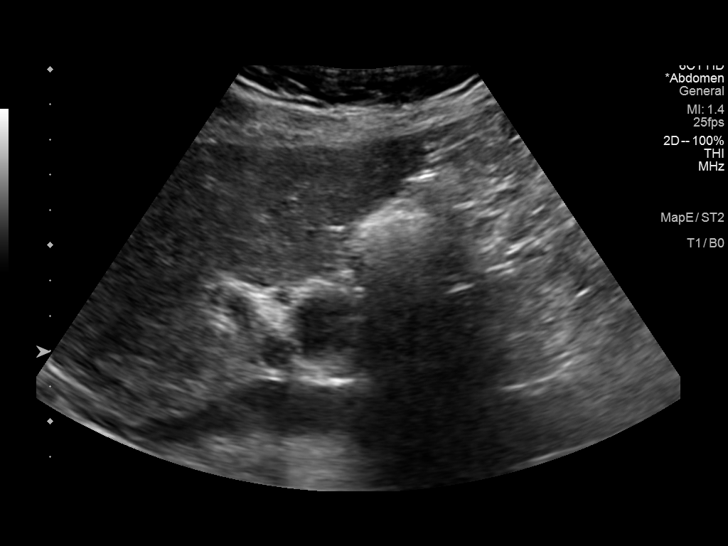
[im 9/106]
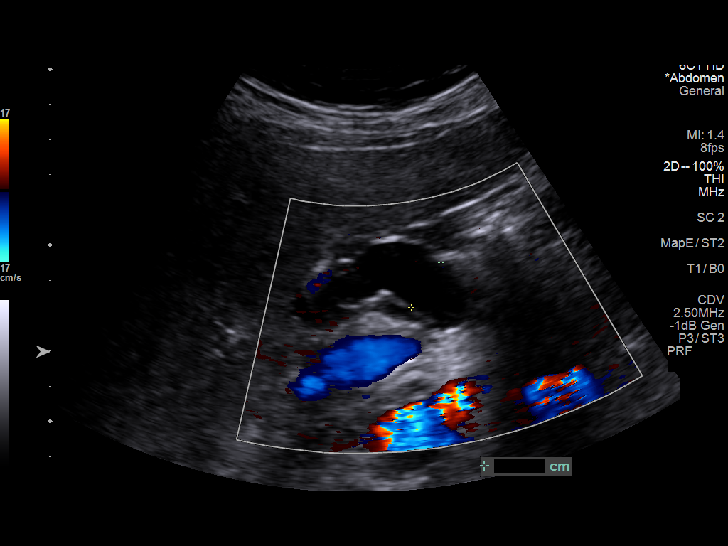
[im 18/106]
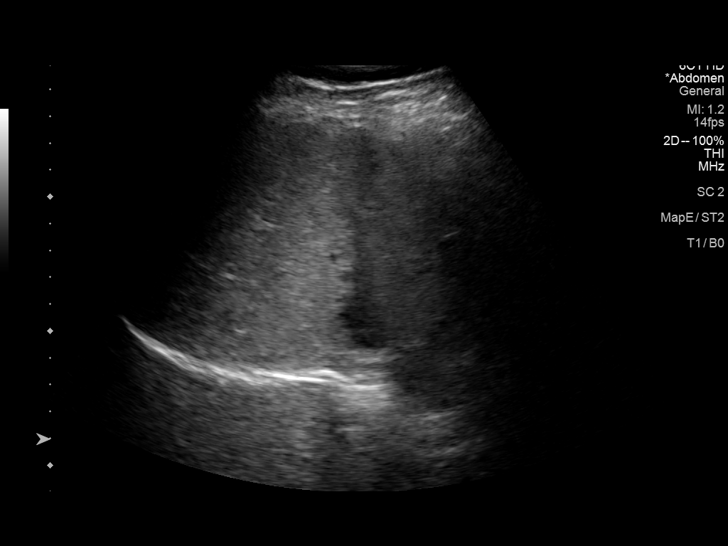
[im 27/106]
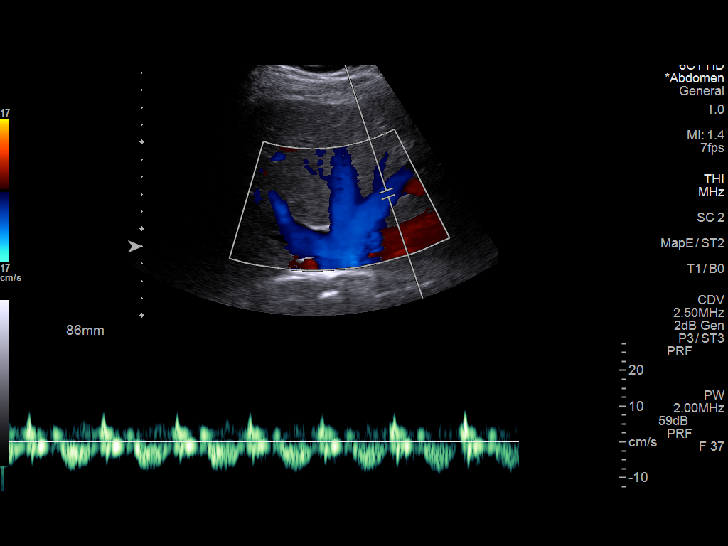
[im 36/106]
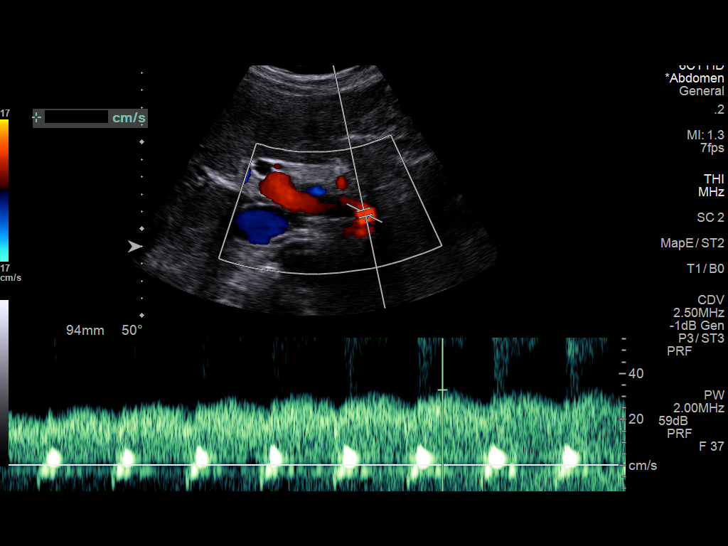
[im 40/106]
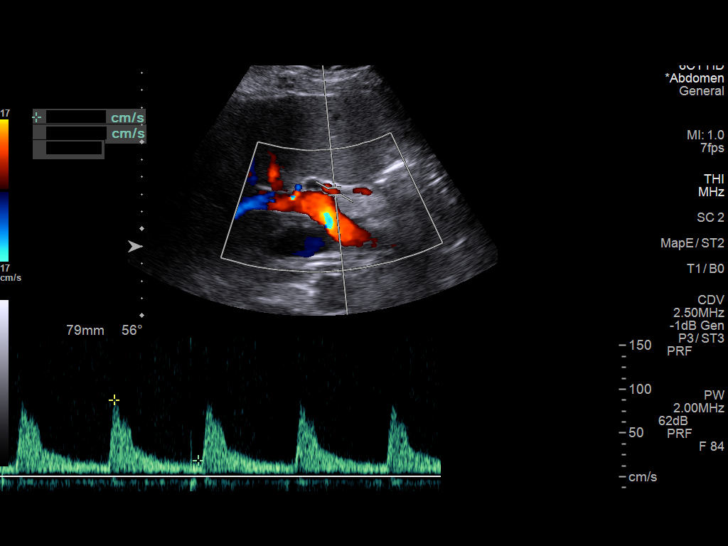
[im 49/106]
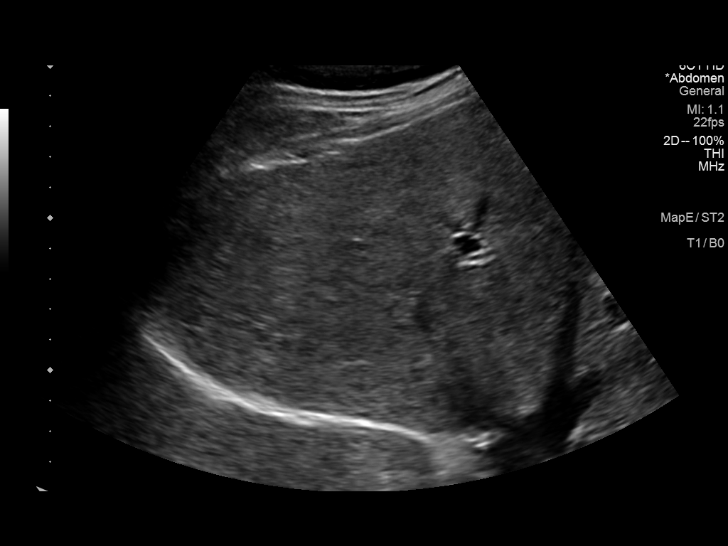
[im 57/106]
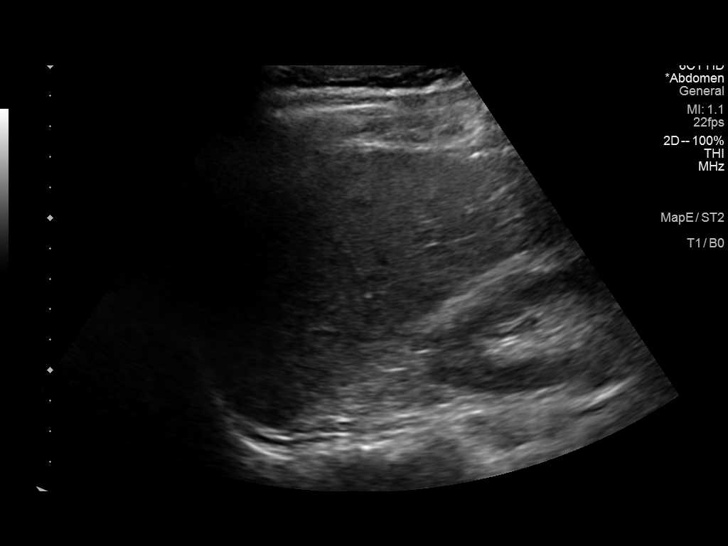
[im 66/106]
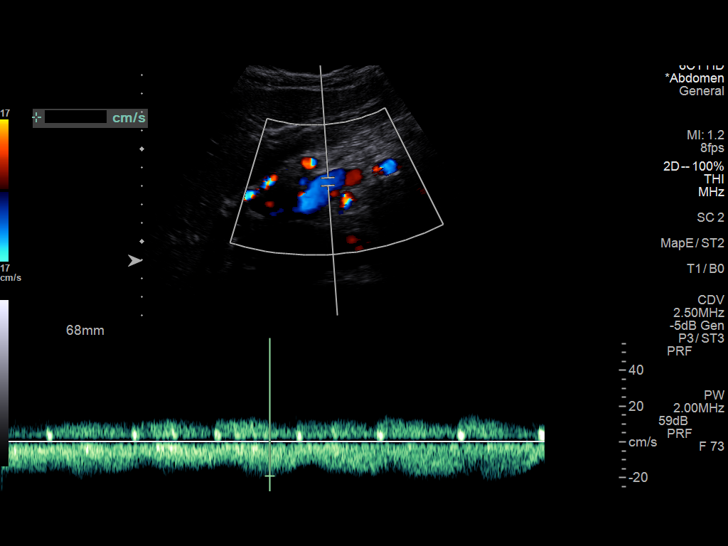
[im 71/106]
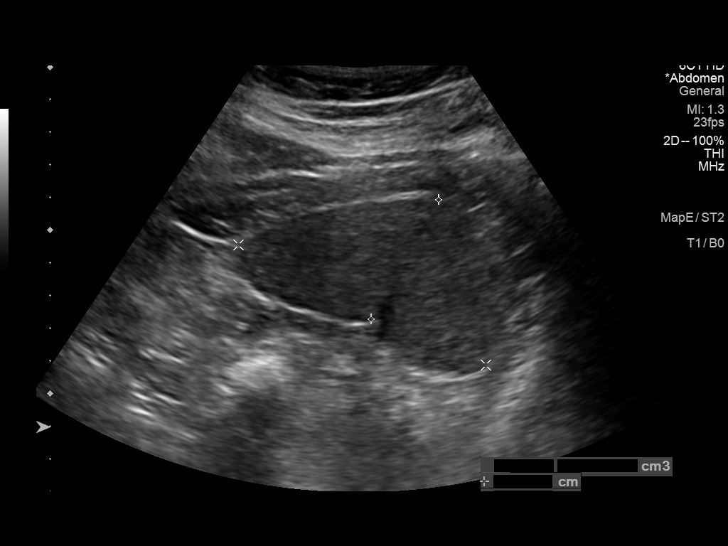
[im 79/106]
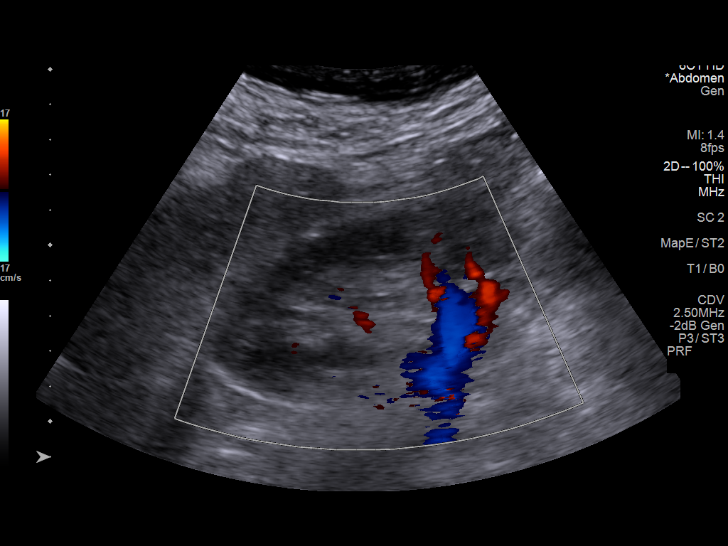
[im 88/106]
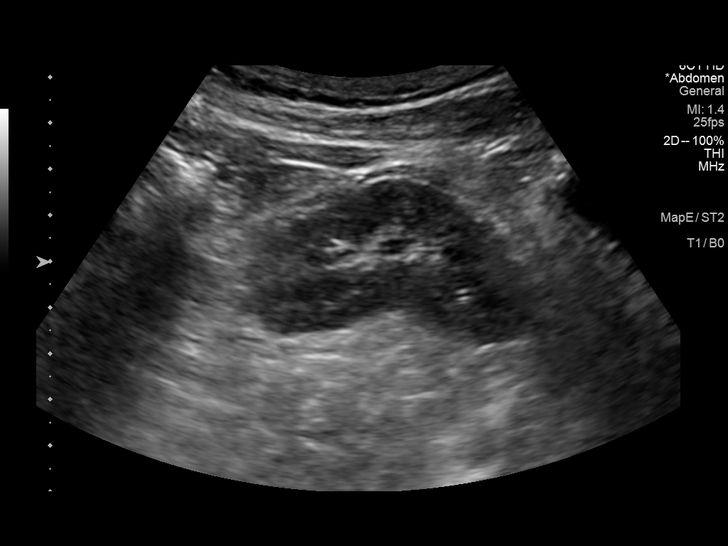
[im 97/106]
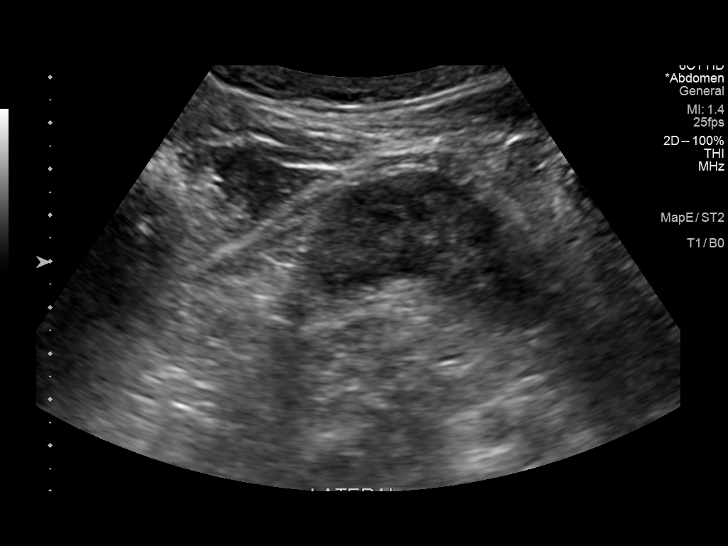
[im 106/106]
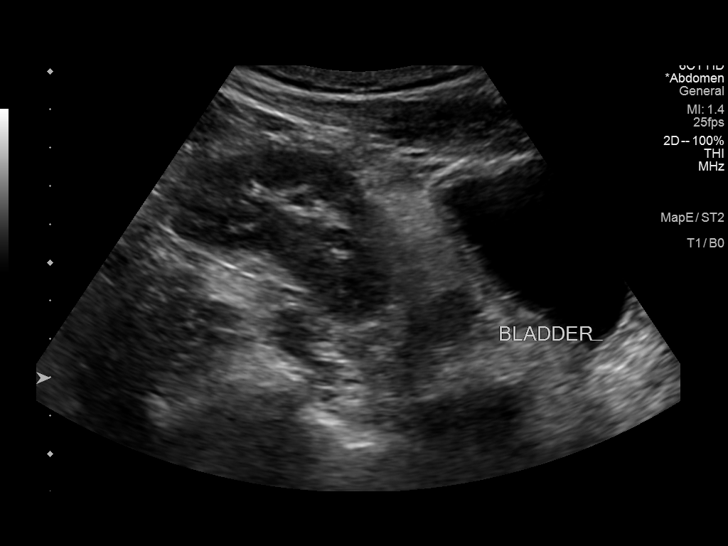

[14 of 25 positions shown; findings below may reference images not displayed]

FINDINGS: Gallbladder:  Surgically absent

Common bile duct:  15.3 mm (previously 12)

Liver: Homogeneous in echotexture without focal lesion or
intrahepatic bile duct dilatation. Hepatic vascular Doppler
evaluation dictated separately.

IVC: Visualized segments unremarkable, intrahepatic portions
obscured by overlying bowel gas.

Pancreas: Visualized segments unremarkable, portions obscured by
overlying bowel gas.

Spleen:  No focal lesion, craniocaudal 9.3cm in length.

Right Kidney:  No mass or hydronephrosis, 11.3cm in length.

Left Kidney: Pelvic kidney. Relatively atrophic, 6.6 cm in length.
No mass or hydronephrosis.

Abdominal aorta: Visualized segments unremarkable, portions obscured
by overlying bowel gas.
IMPRESSION: 1. No acute findings, mass or intrahepatic biliary ductal
dilatation.
2. Slight increase in ectasia of common bile duct

## 2021-02-26 IMAGING — US US HEPATIC LIVER DOPPLER
1 series · 14 of 25 positions shown · non-contrast
Comparison: 10/29/2014

CLINICAL DATA: Liver transplant

EXAM:
DUPLEX ULTRASOUND OF LIVER
TECHNIQUE: Color and duplex Doppler ultrasound was performed to evaluate the
hepatic in-flow and out-flow vessels.

[Series 1: us hepatic liver doppler · 0.20mm/px · 14 of 106 slices shown]
[im 1/106]
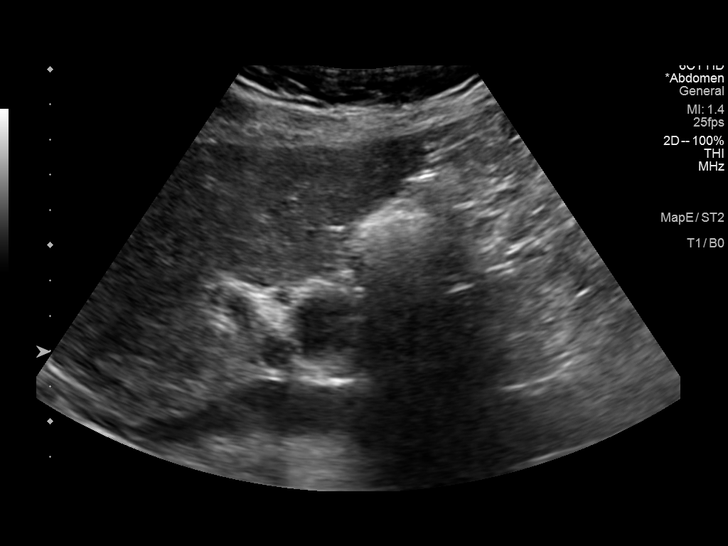
[im 9/106]
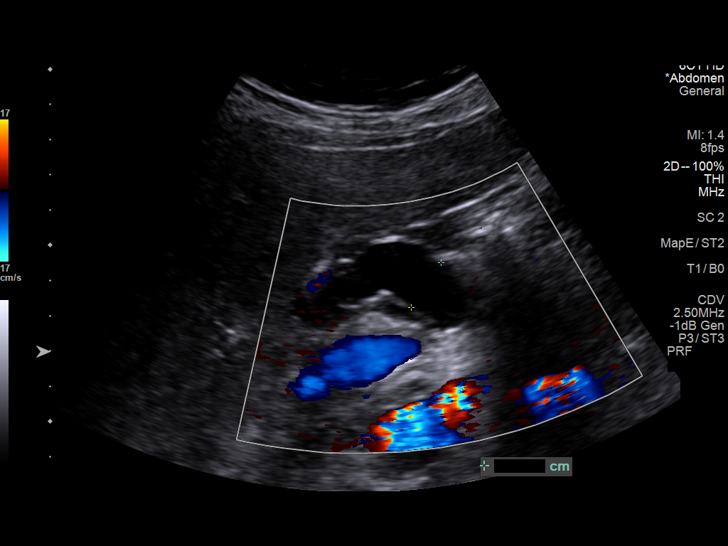
[im 18/106]
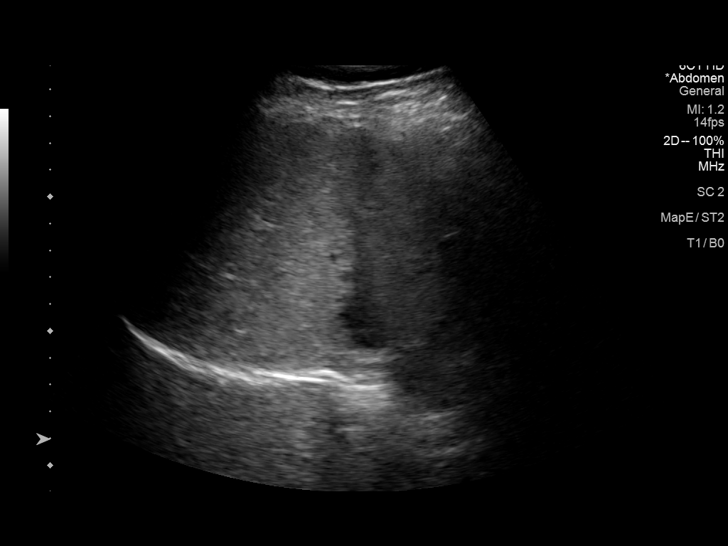
[im 27/106]
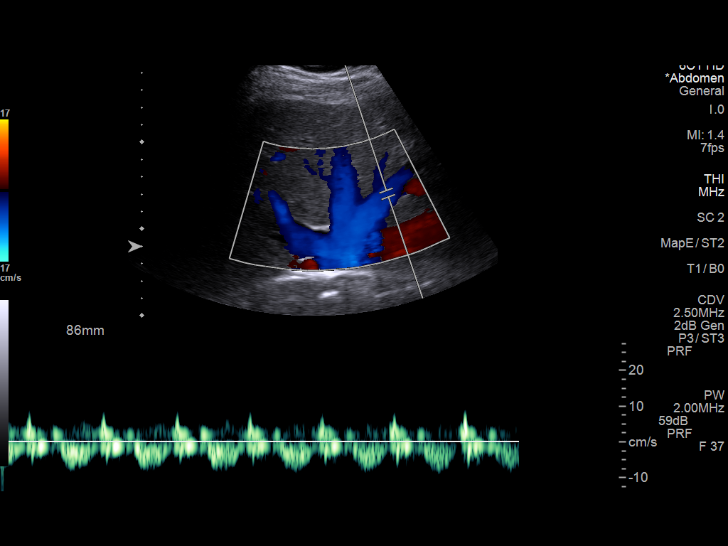
[im 36/106]
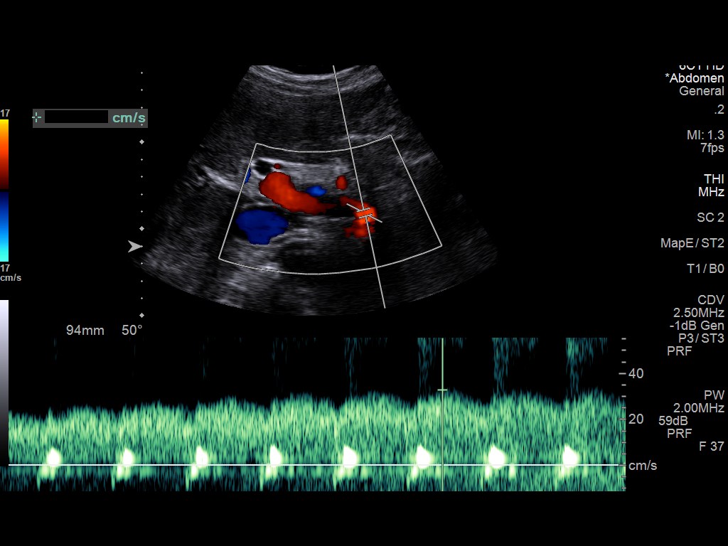
[im 40/106]
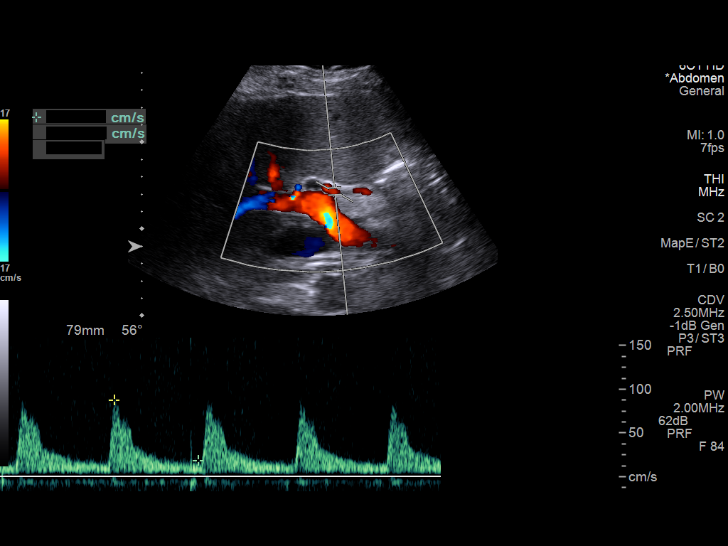
[im 49/106]
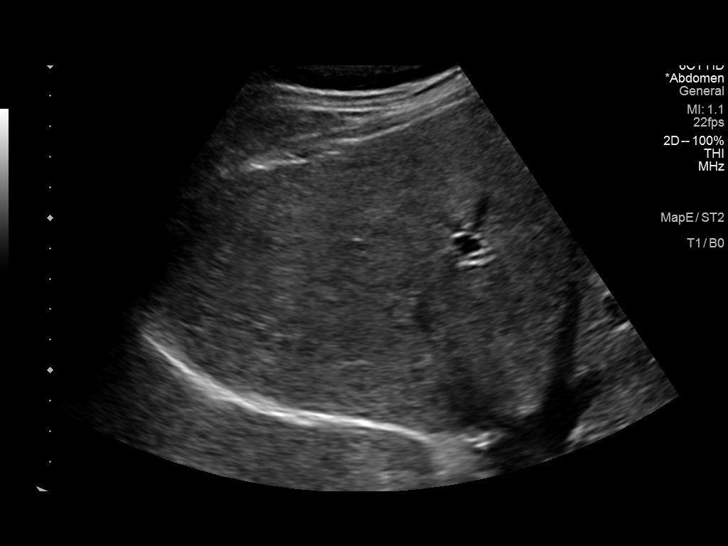
[im 57/106]
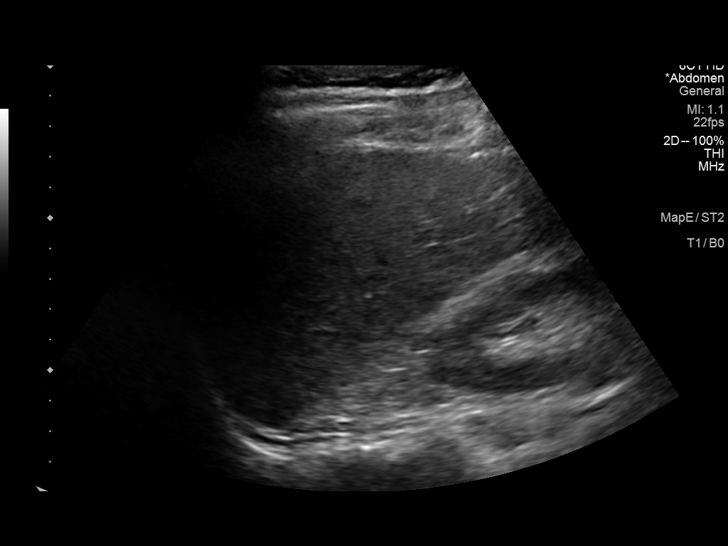
[im 66/106]
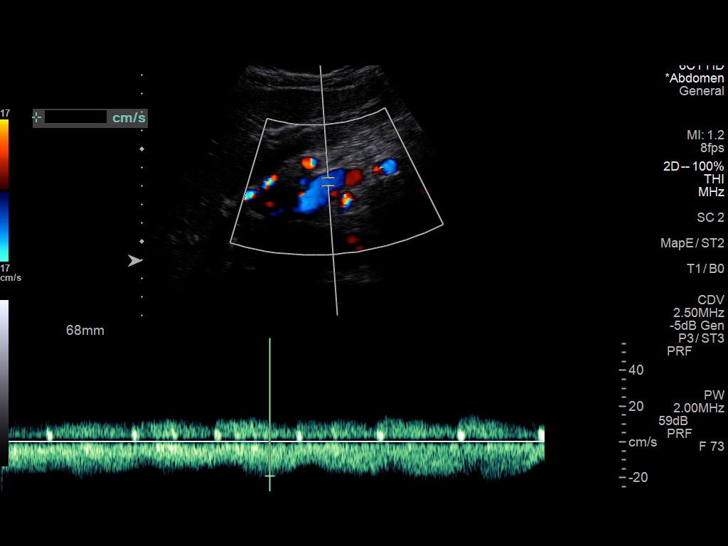
[im 71/106]
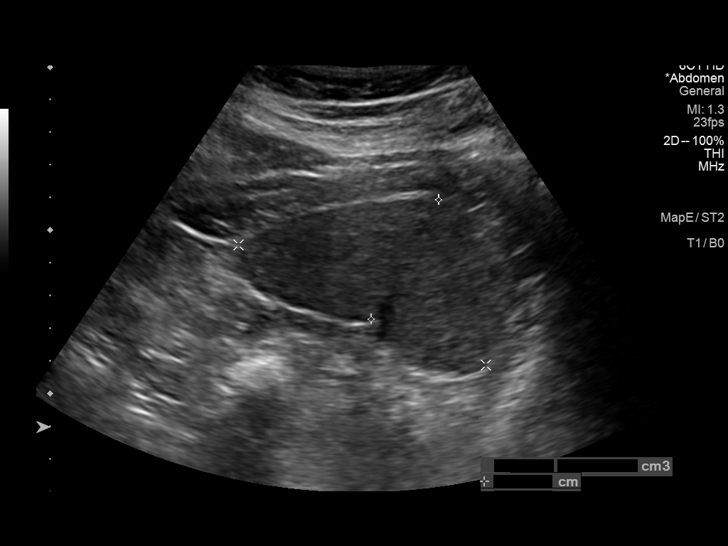
[im 79/106]
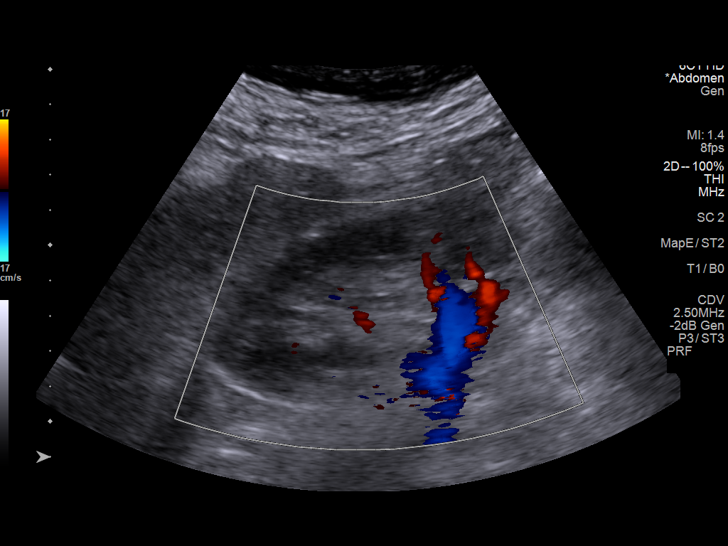
[im 88/106]
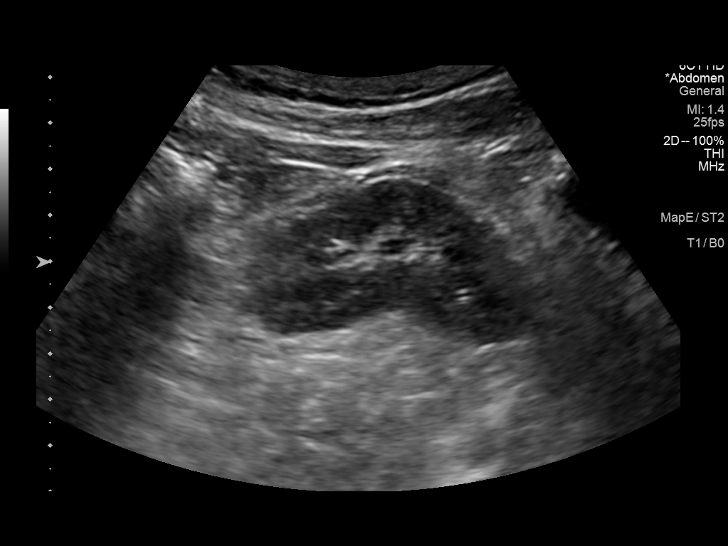
[im 97/106]
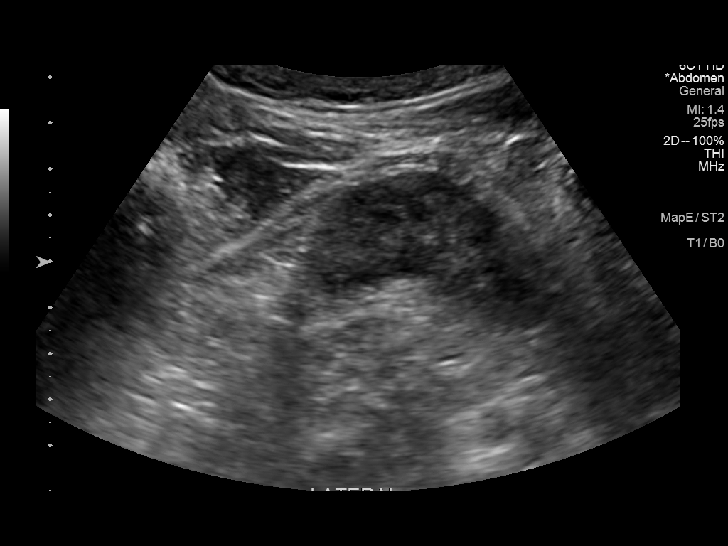
[im 106/106]
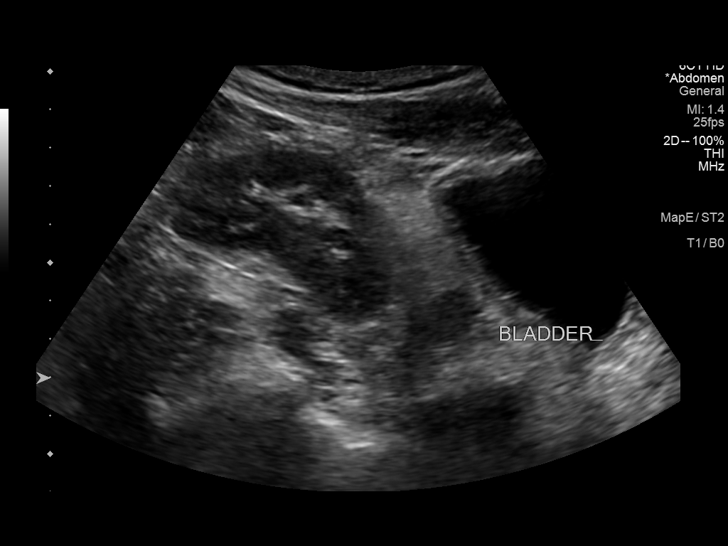

[14 of 25 positions shown; findings below may reference images not displayed]

FINDINGS: Liver: Normal parenchymal echogenicity. Normal hepatic contour
without nodularity.

No focal lesion, mass or intrahepatic biliary ductal dilatation.

Main Portal Vein size: 1.2 cm cm

Portal Vein Velocities

Main Prox:  37 cm/sec

Main Mid: 37 cm/sec

Main Dist:  33 cm/sec
Right: 24 cm/sec
Left: 22 cm/sec

Hepatic Vein Velocities

Right:  12 cm/sec

Middle:  10 cm/sec

Left:  9 cm/sec

IVC: Present and patent with normal respiratory phasicity.

Hepatic Artery Velocity:  88 cm/sec

Splenic Vein Velocity:  19 cm/sec

Spleen: 9.3 cm x 8.4 cm x 4.2 cm with a total volume of 172 cm^3
(411 cm^3 is upper limit normal)

Portal Vein Occlusion/Thrombus: No

Splenic Vein Occlusion/Thrombus: No

Ascites: None

Varices: None

Patent portal, hepatic and splenic veins with normal directional
flow. Negative for portal vein occlusion or thrombus.
IMPRESSION: Normal hepatic venous Doppler.

## 2021-05-16 ENCOUNTER — Telehealth: Payer: Self-pay | Admitting: Family Medicine

## 2021-05-16 NOTE — Telephone Encounter (Signed)
Copied from [place] [phone]. Topic: Medicare AWV ?>> May 16, 2021 10:05 AM Harris-Coley, Hannah Beat wrote: ?Reason for CRM: Left message for patient to schedule Annual Wellness Visit.  Please schedule with Nurse Health Advisor Charlott Rakes, RN at [facility].  Please call [phone] ask for Juliann Pulse ?

## 2021-05-22 ENCOUNTER — Telehealth: Payer: Self-pay

## 2021-05-22 ENCOUNTER — Telehealth: Payer: Self-pay | Admitting: Family Medicine

## 2021-05-22 NOTE — Telephone Encounter (Signed)
Vml for pt to call back and sch a fu with dr [name] for htn  ?

## 2021-05-22 NOTE — Telephone Encounter (Signed)
error 

## 2021-05-23 ENCOUNTER — Ambulatory Visit (INDEPENDENT_AMBULATORY_CARE_PROVIDER_SITE_OTHER): Payer: Medicare HMO | Admitting: Family Medicine

## 2021-05-23 ENCOUNTER — Encounter: Payer: Self-pay | Admitting: Family Medicine

## 2021-05-23 VITALS — BP 140/76 | HR 72 | Temp 98.6°F | Ht 65.0 in | Wt 201.2 lb

## 2021-05-23 DIAGNOSIS — E785 Hyperlipidemia, unspecified: Secondary | ICD-10-CM

## 2021-05-23 DIAGNOSIS — I10 Essential (primary) hypertension: Secondary | ICD-10-CM

## 2021-05-23 NOTE — Assessment & Plan Note (Signed)
Recently started Lipitor 40 mg daily.  We can recheck lipid panel at his CPE. ?

## 2021-05-23 NOTE — Assessment & Plan Note (Signed)
At goal on amlodipine 10 mg daily and Cardura 8 mg daily.  Continue home monitoring.  Follow-up at next office visit. ?

## 2021-05-23 NOTE — Progress Notes (Signed)
? ?  Bruce Conner is a 66 y.o. male who presents today for an office visit. ? ?Assessment/Plan:  ?Chronic Problems Addressed Today: ?Essential hypertension ?At goal on amlodipine 10 mg daily and Cardura 8 mg daily.  Continue home monitoring.  Follow-up at next office visit. ? ?Dyslipidemia ?Recently started Lipitor 40 mg daily.  We can recheck lipid panel at his CPE. ? ?  ?Subjective:  ?HPI: ? ?See A/p for status of chronic conditions.  ? ?   ?  ?Objective:  ?Physical Exam: ?BP 140/76 (BP Location: Left Arm) Comment (BP Location): manually  Pulse 72   Temp 98.6 ?F (37 ?C) (Temporal)   Ht '5\' 5"'$  (1.651 m)   Wt 201 lb 3.2 oz (91.3 kg)   SpO2 94%   BMI 33.48 kg/m?   ?Gen: No acute distress, resting comfortably ?CV: Regular rate and rhythm with no murmurs appreciated ?Pulm: Normal work of breathing, clear to auscultation bilaterally with no crackles, wheezes, or rhonchi ?Neuro: Grossly normal, moves all extremities ?Psych: Normal affect and thought content ? ?   ? ?Algis Greenhouse. [name], MD ?05/23/2021 10:51 AM  ?

## 2021-05-23 NOTE — Patient Instructions (Signed)
It was very nice to see you today! ? ?I am glad you are doing well!  ? ?Keep up the good work! ? ?We will see you back when you are due for your next physical. Please come back sooner if needed.  ? ?Take care, ?Dr [name] ? ?PLEASE NOTE: ? ?If you had any lab tests please let us know if you have not heard back within a few days. You may see your results on mychart before we have a chance to review them but we will give you a call once they are reviewed by [place]. If we ordered any referrals today, please let us know if you have not heard from their office within the next week.  ? ?Please try these tips to maintain a healthy lifestyle: ? ?Eat at least 3 REAL meals and 1-2 snacks per day.  Aim for no more than 5 hours between eating.  If you eat breakfast, please do so within one hour of getting up.  ? ?Each meal should contain half fruits/vegetables, one quarter protein, and one quarter carbs (no bigger than a computer mouse) ? ?Cut down on sweet beverages. This includes juice, soda, and sweet tea.  ? ?Drink at least 1 glass of water with each meal and aim for at least 8 glasses per day ? ?Exercise at least 150 minutes every week.   ?

## 2021-05-26 ENCOUNTER — Ambulatory Visit (INDEPENDENT_AMBULATORY_CARE_PROVIDER_SITE_OTHER): Payer: Medicare HMO

## 2021-05-26 DIAGNOSIS — Z0000 Encounter for general adult medical examination without abnormal findings: Secondary | ICD-10-CM | POA: Diagnosis not present

## 2021-05-26 NOTE — Patient Instructions (Addendum)
Mr. Bruce Conner , ?Thank you for taking time to come for your Medicare Wellness Visit. I appreciate your ongoing commitment to your health goals. Please review the following plan we discussed and let me know if I can assist you in the future.  ? ?Screening recommendations/referrals: ?Colonoscopy: Done 07/11/19 repeat every 7 years  ?Recommended yearly ophthalmology/optometry visit for glaucoma screening and checkup ?Recommended yearly dental visit for hygiene and checkup ? ?Vaccinations: ?Influenza vaccine: Done 01/26/21 repeat every year  ?Pneumococcal vaccine: Postponed until 05/24/22 ?Tdap vaccine: Due and discussed  ?Shingles vaccine: 1st dose 03/25/21  ?Covid-19: Completed 5/18, 06/26/19  ? ?Advanced directives: Advance directive discussed with you today. Even though you declined this today please call our office should you change your mind and we can give you the proper paperwork for you to fill out. ? ?Conditions/risks identified: Exercise more  ? ?Next appointment: Follow up in one year for your annual wellness visit.  ? ?Preventive Care 38 Years and Older, Male ?Preventive care refers to lifestyle choices and visits with your health care provider that can promote health and wellness. ?What does preventive care include? ?A yearly physical exam. This is also called an annual well check. ?Dental exams once or twice a year. ?Routine eye exams. Ask your health care provider how often you should have your eyes checked. ?Personal lifestyle choices, including: ?Daily care of your teeth and gums. ?Regular physical activity. ?Eating a healthy diet. ?Avoiding tobacco and drug use. ?Limiting alcohol use. ?Practicing safe sex. ?Taking low doses of aspirin every day. ?Taking vitamin and mineral supplements as recommended by your health care provider. ?What happens during an annual well check? ?The services and screenings done by your health care provider during your annual well check will depend on your age, overall health, lifestyle  risk factors, and family history of disease. ?Counseling  ?Your health care provider may ask you questions about your: ?Alcohol use. ?Tobacco use. ?Drug use. ?Emotional well-being. ?Home and relationship well-being. ?Sexual activity. ?Eating habits. ?History of falls. ?Memory and ability to understand (cognition). ?Work and work Statistician. ?Screening  ?You may have the following tests or measurements: ?Height, weight, and BMI. ?Blood pressure. ?Lipid and cholesterol levels. These may be checked every 5 years, or more frequently if you are over 72 years old. ?Skin check. ?Lung cancer screening. You may have this screening every year starting at age 82 if you have a 30-pack-year history of smoking and currently smoke or have quit within the past 15 years. ?Fecal occult blood test (FOBT) of the stool. You may have this test every year starting at age 76. ?Flexible sigmoidoscopy or colonoscopy. You may have a sigmoidoscopy every 5 years or a colonoscopy every 10 years starting at age 39. ?Prostate cancer screening. Recommendations will vary depending on your family history and other risks. ?Hepatitis C blood test. ?Hepatitis B blood test. ?Sexually transmitted disease (STD) testing. ?Diabetes screening. This is done by checking your blood sugar (glucose) after you have not eaten for a while (fasting). You may have this done every 1-3 years. ?Abdominal aortic aneurysm (AAA) screening. You may need this if you are a current or former smoker. ?Osteoporosis. You may be screened starting at age 32 if you are at high risk. ?Talk with your health care provider about your test results, treatment options, and if necessary, the need for more tests. ?Vaccines  ?Your health care provider may recommend certain vaccines, such as: ?Influenza vaccine. This is recommended every year. ?Tetanus, diphtheria, and acellular pertussis (Tdap,  Td) vaccine. You may need a Td booster every 10 years. ?Zoster vaccine. You may need this after age  9. ?Pneumococcal 13-valent conjugate (PCV13) vaccine. One dose is recommended after age 42. ?Pneumococcal polysaccharide (PPSV23) vaccine. One dose is recommended after age 69. ?Talk to your health care provider about which screenings and vaccines you need and how often you need them. ?This information is not intended to replace advice given to you by your health care provider. Make sure you discuss any questions you have with your health care provider. ?Document Released: 02/01/2015 Document Revised: 09/25/2015 Document Reviewed: 11/06/2014 ?Elsevier Interactive Patient Education ? 2017 [place]. ? ?Fall Prevention in the Home ?Falls can cause injuries. They can happen to people of all ages. There are many things you can do to make your home safe and to help prevent falls. ?What can I do on the outside of my home? ?Regularly fix the edges of walkways and driveways and fix any cracks. ?Remove anything that might make you trip as you walk through a door, such as a raised step or threshold. ?Trim any bushes or trees on the path to your home. ?Use bright outdoor lighting. ?Clear any walking paths of anything that might make someone trip, such as rocks or tools. ?Regularly check to see if handrails are loose or broken. Make sure that both sides of any steps have handrails. ?Any raised decks and porches should have guardrails on the edges. ?Have any leaves, snow, or ice cleared regularly. ?Use sand or salt on walking paths during winter. ?Clean up any spills in your garage right away. This includes oil or grease spills. ?What can I do in the bathroom? ?Use night lights. ?Install grab bars by the toilet and in the tub and shower. Do not use towel bars as grab bars. ?Use non-skid mats or decals in the tub or shower. ?If you need to sit down in the shower, use a plastic, non-slip stool. ?Keep the floor dry. Clean up any water that spills on the floor as soon as it happens. ?Remove soap buildup in the tub or shower  regularly. ?Attach bath mats securely with double-sided non-slip rug tape. ?Do not have throw rugs and other things on the floor that can make you trip. ?What can I do in the bedroom? ?Use night lights. ?Make sure that you have a light by your bed that is easy to reach. ?Do not use any sheets or blankets that are too big for your bed. They should not hang down onto the floor. ?Have a firm chair that has side arms. You can use this for support while you get dressed. ?Do not have throw rugs and other things on the floor that can make you trip. ?What can I do in the kitchen? ?Clean up any spills right away. ?Avoid walking on wet floors. ?Keep items that you use a lot in easy-to-reach places. ?If you need to reach something above you, use a strong step stool that has a grab bar. ?Keep electrical cords out of the way. ?Do not use floor polish or wax that makes floors slippery. If you must use wax, use non-skid floor wax. ?Do not have throw rugs and other things on the floor that can make you trip. ?What can I do with my stairs? ?Do not leave any items on the stairs. ?Make sure that there are handrails on both sides of the stairs and use them. Fix handrails that are broken or loose. Make sure that handrails are as  long as the stairways. ?Check any carpeting to make sure that it is firmly attached to the stairs. Fix any carpet that is loose or worn. ?Avoid having throw rugs at the top or bottom of the stairs. If you do have throw rugs, attach them to the floor with carpet tape. ?Make sure that you have a light switch at the top of the stairs and the bottom of the stairs. If you do not have them, ask someone to add them for you. ?What else can I do to help prevent falls? ?Wear shoes that: ?Do not have high heels. ?Have rubber bottoms. ?Are comfortable and fit you well. ?Are closed at the toe. Do not wear sandals. ?If you use a stepladder: ?Make sure that it is fully opened. Do not climb a closed stepladder. ?Make sure that  both sides of the stepladder are locked into place. ?Ask someone to hold it for you, if possible. ?Clearly mark and make sure that you can see: ?Any grab bars or handrails. ?First and last steps. ?Where t

## 2021-05-26 NOTE — Progress Notes (Addendum)
Virtual Visit via Telephone Note ? ?I connected with  Bruce Conner on 05/26/21 at  9:00 AM EDT by telephone and verified that I am speaking with the correct person using two identifiers. ? ?Medicare Annual Wellness visit completed telephonically due to Covid-19 pandemic.  ? ?Persons participating in this call: This Health Coach and this patient.  ? ?Location: ?Patient: Home ?Provider: Office ?  ?I discussed the limitations, risks, security and privacy concerns of performing an evaluation and management service by telephone and the availability of in person appointments. The patient expressed understanding and agreed to proceed. ? ?Unable to perform video visit due to video visit attempted and failed and/or patient does not have video capability.  ? ?Some vital signs may be absent or patient reported.  ? ?Willette Brace, LPN ? ? ?Subjective:  ? Bruce Conner is a 66 y.o. male who presents for an Initial Medicare Annual Wellness Visit. ? ?Review of Systems    ? ?Cardiac Risk Factors include: advanced age (>70mn, >>71women);hypertension;dyslipidemia;male gender;obesity (BMI >30kg/m2) ? ?   ?Objective:  ?  ?There were no vitals filed for this visit. ?There is no height or weight on file to calculate BMI. ? ? ?  05/26/2021  ?  9:00 AM 10/20/2017  ? 12:46 AM 10/28/2014  ?  5:42 PM  ?Advanced Directives  ?Does Patient Have a Medical Advance Directive? No No No  ?Would patient like information on creating a medical advance directive? No - Patient declined No - Patient declined No - patient declined information  ? ? ?Current Medications (verified) ?Outpatient Encounter Medications as of 05/26/2021  ?Medication Sig  ? amLODipine (NORVASC) 10 MG tablet Take 1 tablet (10 mg total) by mouth daily.  ? aspirin EC 81 MG tablet Take 1 tablet (81 mg total) by mouth daily.  ? atorvastatin (LIPITOR) 40 MG tablet Take 1 tablet (40 mg total) by mouth daily.  ? doxazosin (CARDURA) 8 MG tablet Take 1 tablet (8 mg total) by mouth daily.   ? K Phos Mono-Sod Phos Di & Mono (VIRT-PHOS 250 NEUTRAL) 155-852-130 MG TABS Take 2 tablets by mouth daily.  ? magnesium oxide (MAG-OX) 400 MG tablet Take 400 mg by mouth daily.  ? mycophenolate (CELLCEPT) 500 MG tablet   ? mycophenolate (MYFORTIC) 360 MG TBEC EC tablet TAKE 2 TABLET BY MOUTH TWICE DAILY  ? omeprazole (PRILOSEC) 20 MG capsule Take 20 mg by mouth daily.  ? sulfamethoxazole-trimethoprim (BACTRIM,SEPTRA) 400-80 MG tablet Take 1 tablet by mouth every Monday, Wednesday, and Friday.   ? tacrolimus (PROGRAF) 1 MG capsule Take 4 capsules (4 mg total) by mouth 2 (two) times daily. Take 4 capsules in the morning and 4 capsules in the evening  ? tadalafil (CIALIS) 10 MG tablet TAKE 1 TABLET(10 MG) BY MOUTH DAILY AS NEEDED FOR ERECTILE DYSFUNCTION  ? PREVNAR 20 0.5 ML injection   ? SHINGRIX injection   ? ?No facility-administered encounter medications on file as of 05/26/2021.  ? ? ?Allergies (verified) ?Patient has no known allergies.  ? ?History: ?Past Medical History:  ?Diagnosis Date  ? Acute renal injury ([facility]   ? Blood transfusion without reported diagnosis   ? Chronic kidney disease, stage III (moderate) ([facility])   ? Cirrhosis of liver ([facility]   ? GERD (gastroesophageal reflux disease)   ? Hypertension   ? Hypomagnesemia   ? Hypophosphatemia   ? Liver failure ([facility]   ? Pyorrhea   ? [place] 11[place]  ? @  [facility]  ? ?Past Surgical History:  ?Procedure Laterality Date  ? ABDOMINAL SURGERY    ? COLONOSCOPY    ? ?2009  ? LIVER TRANSPLANT  11/12/2014  ? [facility]  ? ?Family History  ?Problem Relation Age of Onset  ? Hypertension Brother   ? Colon cancer Neg Hx   ? Colon polyps Neg Hx   ? Esophageal cancer Neg Hx   ? Rectal cancer Neg Hx   ? Stomach cancer Neg Hx   ? ?Social History  ? ?Socioeconomic History  ? Marital status: Married  ?  Spouse name: Not on file  ? Number of children: 3  ? Years of education: 38  ? Highest education level: Not on file  ?Occupational History  ?  Occupation: Warehouse  ?Tobacco Use  ? Smoking status: Never  ? Smokeless tobacco: Never  ?Vaping Use  ? Vaping Use: Never used  ?Substance and Sexual Activity  ? Alcohol use: No  ? Drug use: No  ? Sexual activity: Not on file  ?Other Topics Concern  ? Not on file  ?Social History Narrative  ? Fun: Drag racing   ? ?Social Determinants of Health  ? ?Financial Resource Strain: Low Risk   ? Difficulty of Paying Living Expenses: Not hard at all  ?Food Insecurity: No Food Insecurity  ? Worried About Charity fundraiser in the Last Year: Never true  ? Ran Out of Food in the Last Year: Never true  ?Transportation Needs: No Transportation Needs  ? Lack of Transportation (Medical): No  ? Lack of Transportation (Non-Medical): No  ?Physical Activity: Insufficiently Active  ? Days of Exercise per Week: 2 days  ? Minutes of Exercise per Session: 20 min  ?Stress: No Stress Concern Present  ? Feeling of Stress : Not at all  ?Social Connections: Moderately Isolated  ? Frequency of Communication with Friends and Family: More than three times a week  ? Frequency of Social Gatherings with Friends and Family: More than three times a week  ? Attends Religious Services: Never  ? Active Member of Clubs or Organizations: No  ? Attends Archivist Meetings: Never  ? Marital Status: Married  ? ? ?Tobacco Counseling ?Counseling given: Not Answered ? ? ?Clinical Intake: ? ?Pre-visit preparation completed: Yes ? ?Pain : No/denies pain ? ?  ? ?BMI - recorded: 33.48 ?Nutritional Status: BMI > 30  Obese ?Nutritional Risks: None ?Diabetes: No ? ?How often do you need to have someone help you when you read instructions, pamphlets, or other written materials from your doctor or pharmacy?: 1 - Never ? ?Diabetic?no ? ?Interpreter Needed?: No ? ?Information entered by :: Charlott Rakes, LPN ? ? ?Activities of Daily Living ? ?  05/26/2021  ?  9:01 AM  ?In your present state of health, do you have any difficulty performing the following activities:   ?Hearing? 0  ?Vision? 0  ?Difficulty concentrating or making decisions? 0  ?Walking or climbing stairs? 0  ?Dressing or bathing? 0  ?Doing errands, shopping? 0  ?Preparing Food and eating ? N  ?Using the Toilet? N  ?In the past six months, have you accidently leaked urine? N  ?Do you have problems with loss of bowel control? N  ?Managing your Medications? N  ?Managing your Finances? N  ?Housekeeping or managing your Housekeeping? N  ? ? ?Patient Care Team: ?Vivi Barrack, MD as PCP - General (Family Medicine) ?Justin Mend, MD as Consulting Physician (Nephrology) ?Delemos,  Vianne Bulls, MD as Consulting Physician (Gastroenterology) ? ?Indicate any recent Medical Services you may have received from other than [facility] providers in the past year (date may be approximate). ? ?   ?Assessment:  ? This is a routine wellness examination for Chaka. ? ?Hearing/Vision screen ?Hearing Screening - Comments:: Pt denies any hearing issues ?Vision Screening - Comments:: Encourage to follow up with eye provider for annual eye exams  ? ?Dietary issues and exercise activities discussed: ?Current Exercise Habits: Home exercise routine, Type of exercise: walking, Time (Minutes): 20, Frequency (Times/Week): 2, Weekly Exercise (Minutes/Week): 40 ? ? Goals Addressed   ? ?  ?  ?  ?  ? This Visit's Progress  ?  Patient Stated     ?  Exercise more  ?  ? ?  ? ?Depression Screen ? ?  05/26/2021  ?  8:59 AM 01/27/2021  ?  2:23 PM 12/06/2018  ?  2:28 PM 06/22/2017  ? 11:11 AM 02/05/2017  ? 10:51 AM 10/27/2016  ?  9:39 AM  ?PHQ 2/9 Scores  ?PHQ - 2 Score 0 0 0 0 0 0  ?  ?Fall Risk ? ?  05/26/2021  ?  9:01 AM 01/27/2021  ?  2:22 PM 12/06/2018  ?  2:28 PM 06/22/2017  ? 11:11 AM 02/05/2017  ? 10:51 AM  ?Fall Risk   ?Falls in the past year? 0 0 0 No No  ?Number falls in past yr: 0 0     ?Injury with Fall? 0 0     ?Follow up Falls prevention discussed      ? ? ?FALL RISK PREVENTION PERTAINING TO THE HOME: ? ?Any stairs in or around the home? No  ?If so, are there  any without handrails? No  ?Home free of loose throw rugs in walkways, pet beds, electrical cords, etc? Yes  ?Adequate lighting in your home to reduce risk of falls? Yes  ? ?ASSISTIVE DEVICES UTILIZED TO

## 2021-08-06 ENCOUNTER — Other Ambulatory Visit: Payer: Self-pay | Admitting: *Deleted

## 2021-08-06 MED ORDER — ATORVASTATIN CALCIUM 40 MG PO TABS: 40.0000 mg | ORAL_TABLET | Freq: Every day | ORAL | 1 refills | Status: DC

## 2021-08-06 MED ORDER — TADALAFIL 10 MG PO TABS: ORAL_TABLET | ORAL | 5 refills | Status: DC

## 2021-08-12 ENCOUNTER — Ambulatory Visit (INDEPENDENT_AMBULATORY_CARE_PROVIDER_SITE_OTHER): Payer: Medicare HMO | Admitting: Family Medicine

## 2021-08-12 ENCOUNTER — Encounter: Payer: Self-pay | Admitting: Family Medicine

## 2021-08-12 VITALS — BP 132/74 | HR 70 | Temp 98.4°F | Ht 65.0 in | Wt 197.8 lb

## 2021-08-12 DIAGNOSIS — I10 Essential (primary) hypertension: Secondary | ICD-10-CM | POA: Diagnosis not present

## 2021-08-12 DIAGNOSIS — M25511 Pain in right shoulder: Secondary | ICD-10-CM

## 2021-08-12 DIAGNOSIS — Z944 Liver transplant status: Secondary | ICD-10-CM

## 2021-08-12 DIAGNOSIS — N183 Chronic kidney disease, stage 3 unspecified: Secondary | ICD-10-CM

## 2021-08-12 MED ORDER — PREDNISONE 50 MG PO TABS: ORAL_TABLET | ORAL | 0 refills | Status: DC

## 2021-08-12 NOTE — Assessment & Plan Note (Signed)
At goal today on amlodipine 10 mg daily and Cardura 8 mg daily.

## 2021-08-12 NOTE — Progress Notes (Signed)
   Bruce Conner is a 66 y.o. male who presents today for an office visit.  Assessment/Plan:  New/Acute Problems: Shoulder Pain Likely biceps tendinitis though may have some underlying rotator cuff tendinopathy as well.  We will not give NSAIDs due to his history of CKD.  We will start prednisone burst.  Home exercise program was discussed and handout was given.  He will let me know if not proving in the next couple of weeks and we can refer to PT at sports medicine at that time.  Chronic Problems Addressed Today: Status post liver transplant ([place]) We will avoid Tylenol.  He is doing well and routinely follows with hepatology clinic.  Essential hypertension At goal today on amlodipine 10 mg daily and Cardura 8 mg daily.  CKD (chronic kidney disease) stage 3, GFR 30-59 ml/min (HCC) Follows with nephrology.  Will avoid NSAIDs.     Subjective:  HPI:  See A/p for status of chronic conditions.  He is here today with right shoulder pain. This started about 1-2 years ago but have gotten much worse last few months. He thinks hit started after getting his first round of covid shots. No obvious injuries or precipitating events.  He has a history of liver transplant as well as CKD and has not taken any over-the-counter meds.  No home exercises tried.  No other obvious alleviating or aggravating factors.       Objective:  Physical Exam: BP 132/74   Pulse 70   Temp 98.4 F (36.9 C) (Temporal)   Ht '5\' 5"'$  (1.651 m)   Wt 197 lb 12.8 oz (89.7 kg)   SpO2 95%   BMI 32.92 kg/m   Gen: No acute distress, resting comfortably MSK: - right shoulder: No deformities.  Full range of motion throughout.  Tenderness palpation along biceps tendons.  Pain elicited also with external rotation.  Normal supraspinatus testing.  Normal internal rotation.  Neer and Hawkins test negative.  Positive speeds test Neuro: Grossly normal, moves all extremities Psych: Normal affect and thought content      Bruce Conner  M. [name], MD 08/12/2021 3:09 PM

## 2021-08-12 NOTE — Assessment & Plan Note (Signed)
We will avoid Tylenol.  He is doing well and routinely follows with hepatology clinic.

## 2021-08-12 NOTE — Patient Instructions (Signed)
It was very nice to see you today!  You have inflammation in your biceps.  Please take the prednisone 50 mg daily for the next 5 days.  Work on the exercises.  Let me know if not proving in 1 to 2 weeks.  Take care, Dr [name]  PLEASE NOTE:  If you had any lab tests please let us know if you have not heard back within a few days. You may see your results on mychart before we have a chance to review them but we will give you a call once they are reviewed by [place]. If we ordered any referrals today, please let us know if you have not heard from their office within the next week.   Please try these tips to maintain a healthy lifestyle:  Eat at least 3 REAL meals and 1-2 snacks per day.  Aim for no more than 5 hours between eating.  If you eat breakfast, please do so within one hour of getting up.   Each meal should contain half fruits/vegetables, one quarter protein, and one quarter carbs (no bigger than a computer mouse)  Cut down on sweet beverages. This includes juice, soda, and sweet tea.   Drink at least 1 glass of water with each meal and aim for at least 8 glasses per day  Exercise at least 150 minutes every week.

## 2021-08-12 NOTE — Assessment & Plan Note (Signed)
Follows with nephrology.  Will avoid NSAIDs.

## 2021-10-13 ENCOUNTER — Encounter: Payer: Self-pay | Admitting: *Deleted

## 2021-11-05 ENCOUNTER — Other Ambulatory Visit: Payer: Self-pay | Admitting: Family Medicine

## 2021-11-10 DIAGNOSIS — D849 Immunodeficiency, unspecified: Secondary | ICD-10-CM | POA: Diagnosis not present

## 2021-11-10 DIAGNOSIS — Z944 Liver transplant status: Secondary | ICD-10-CM | POA: Diagnosis not present

## 2021-11-17 DIAGNOSIS — Z944 Liver transplant status: Secondary | ICD-10-CM | POA: Diagnosis not present

## 2021-11-17 DIAGNOSIS — D849 Immunodeficiency, unspecified: Secondary | ICD-10-CM | POA: Diagnosis not present

## 2021-11-21 DIAGNOSIS — D849 Immunodeficiency, unspecified: Secondary | ICD-10-CM | POA: Diagnosis not present

## 2021-11-21 DIAGNOSIS — Z944 Liver transplant status: Secondary | ICD-10-CM | POA: Diagnosis not present

## 2021-12-03 DIAGNOSIS — D849 Immunodeficiency, unspecified: Secondary | ICD-10-CM | POA: Diagnosis not present

## 2021-12-03 DIAGNOSIS — Z944 Liver transplant status: Secondary | ICD-10-CM | POA: Diagnosis not present

## 2021-12-10 DIAGNOSIS — D849 Immunodeficiency, unspecified: Secondary | ICD-10-CM | POA: Diagnosis not present

## 2021-12-10 DIAGNOSIS — Z944 Liver transplant status: Secondary | ICD-10-CM | POA: Diagnosis not present

## 2022-01-01 ENCOUNTER — Encounter: Payer: Self-pay | Admitting: *Deleted

## 2022-01-07 DIAGNOSIS — Z944 Liver transplant status: Secondary | ICD-10-CM | POA: Diagnosis not present

## 2022-01-07 DIAGNOSIS — D849 Immunodeficiency, unspecified: Secondary | ICD-10-CM | POA: Diagnosis not present

## 2022-01-14 ENCOUNTER — Telehealth: Payer: Self-pay | Admitting: Family Medicine

## 2022-01-14 NOTE — Telephone Encounter (Signed)
  Encourage patient to contact the pharmacy for refills or they can request refills through [facility]:  Please schedule appointment if longer than 1 year  NEXT APPOINTMENT DATE: 06/08/22  MEDICATION: doxazosin (CARDURA) 8 MG tablet   Is the patient out of medication? No  PHARMACY:  [facility], [place] Phone: [phone]  Fax: [phone]      Let patient know to contact pharmacy at the end of the day to make sure medication is ready.  Please notify patient to allow 48-72 hours to process

## 2022-01-15 ENCOUNTER — Other Ambulatory Visit: Payer: Self-pay

## 2022-01-15 MED ORDER — DOXAZOSIN MESYLATE 8 MG PO TABS: 8.0000 mg | ORAL_TABLET | Freq: Every day | ORAL | 0 refills | Status: DC

## 2022-01-15 NOTE — Telephone Encounter (Signed)
Rx sent to pharmacy   

## 2022-01-22 ENCOUNTER — Other Ambulatory Visit: Payer: Self-pay | Admitting: *Deleted

## 2022-01-22 MED ORDER — TADALAFIL 10 MG PO TABS: ORAL_TABLET | ORAL | 5 refills | Status: DC

## 2022-02-06 DIAGNOSIS — N183 Chronic kidney disease, stage 3 unspecified: Secondary | ICD-10-CM | POA: Diagnosis not present

## 2022-02-06 DIAGNOSIS — Z23 Encounter for immunization: Secondary | ICD-10-CM | POA: Diagnosis not present

## 2022-02-06 DIAGNOSIS — Z944 Liver transplant status: Secondary | ICD-10-CM | POA: Diagnosis not present

## 2022-02-06 DIAGNOSIS — I129 Hypertensive chronic kidney disease with stage 1 through stage 4 chronic kidney disease, or unspecified chronic kidney disease: Secondary | ICD-10-CM | POA: Diagnosis not present

## 2022-02-07 LAB — LAB REPORT - SCANNED
Creatinine, POC: 150.3 mg/dL
Protein/Creatinine Ratio: 128

## 2022-02-12 DIAGNOSIS — D849 Immunodeficiency, unspecified: Secondary | ICD-10-CM | POA: Diagnosis not present

## 2022-02-12 DIAGNOSIS — Z944 Liver transplant status: Secondary | ICD-10-CM | POA: Diagnosis not present

## 2022-02-15 ENCOUNTER — Other Ambulatory Visit: Payer: Self-pay | Admitting: Family Medicine

## 2022-04-03 ENCOUNTER — Encounter: Payer: Self-pay | Admitting: Family Medicine

## 2022-04-03 ENCOUNTER — Ambulatory Visit (INDEPENDENT_AMBULATORY_CARE_PROVIDER_SITE_OTHER): Payer: Medicare HMO | Admitting: Family Medicine

## 2022-04-03 VITALS — BP 119/71 | HR 71 | Temp 98.2°F | Ht 65.0 in | Wt 195.0 lb

## 2022-04-03 DIAGNOSIS — Z23 Encounter for immunization: Secondary | ICD-10-CM | POA: Diagnosis not present

## 2022-04-03 DIAGNOSIS — I872 Venous insufficiency (chronic) (peripheral): Secondary | ICD-10-CM | POA: Diagnosis not present

## 2022-04-03 DIAGNOSIS — R600 Localized edema: Secondary | ICD-10-CM | POA: Diagnosis not present

## 2022-04-03 DIAGNOSIS — I10 Essential (primary) hypertension: Secondary | ICD-10-CM | POA: Diagnosis not present

## 2022-04-03 MED ORDER — TRIAMCINOLONE ACETONIDE 0.5 % EX OINT: 1.0000 | TOPICAL_OINTMENT | Freq: Two times a day (BID) | CUTANEOUS | 0 refills | Status: AC

## 2022-04-03 NOTE — Progress Notes (Signed)
   Bruce Conner is a 67 y.o. male who presents today for an office visit.  Assessment/Plan:  Chronic Problems Addressed Today: Stasis dermatitis Rash consistent with stasis dermatitis likely secondary to lower extremity edema.  No signs of cellulitis or infection today.  Will start topical triamcinolone.  Discussed importance of salt avoidance, leg elevation, and compression stockings.  He will let me know if not proving in the next 1 to 2 weeks.  Leg edema Likely multifactorial in setting of previous liver transplant and CKD.  Also likely has some venous insufficiency as well.  He is currently on amlodipine 10 mg daily for blood pressure control which is contributing as well.  We will manage conservatively with leg elevation, salt avoidance, and compression stockings.  He will let me know if not proving.  Will consider weaning off or stopping amlodipine if not improving with above conservative measures.  Essential hypertension Blood pressure is at goal today.  Will continue his current regimen losartan 25 mg daily, amlodipine 10 mg daily and cardura 8 mg daily.  As above amlodipine may be contributing to lower extremity edema.  And may need to wean off at some point soon.     Subjective:  HPI:  See A/p for status of chronic conditions.  Main concern today is rash on left leg. This has been present for a few weeks. Tried vaseline without much improvement. Rash is itchy and burning. No fevers or chills. No drainage. A lot itching.  No pain.       Objective:  Physical Exam: BP 119/71 (BP Location: Right Arm, Patient Position: Sitting)   Pulse 71   Temp 98.2 F (36.8 C) (Temporal)   Ht 5\' 5"  (1.651 m)   Wt 195 lb (88.5 kg)   SpO2 96%   BMI 32.45 kg/m   Wt Readings from Last 3 Encounters:  04/03/22 195 lb (88.5 kg)  08/12/21 197 lb 12.8 oz (89.7 kg)  05/23/21 201 lb 3.2 oz (91.3 kg)    Gen: No acute distress, resting comfortably CV: Regular rate and rhythm with no murmurs  appreciated Pulm: Normal work of breathing, clear to auscultation bilaterally with no crackles, wheezes, or rhonchi Skin: -Confluent approximately 15 cm hyperpigmented area on medial aspect of left lower leg.  Small amount of erythema and excoriation noted -MSK: 2+ pitting edema to knees bilaterally. Neuro: Grossly normal, moves all extremities Psych: Normal affect and thought content      Bruce Conner M. [name], MD 04/03/2022 2:18 PM

## 2022-04-03 NOTE — Assessment & Plan Note (Signed)
Rash consistent with stasis dermatitis likely secondary to lower extremity edema.  No signs of cellulitis or infection today.  Will start topical triamcinolone.  Discussed importance of salt avoidance, leg elevation, and compression stockings.  He will let me know if not proving in the next 1 to 2 weeks.

## 2022-04-03 NOTE — Assessment & Plan Note (Signed)
Likely multifactorial in setting of previous liver transplant and CKD.  Also likely has some venous insufficiency as well.  He is currently on amlodipine 10 mg daily for blood pressure control which is contributing as well.  We will manage conservatively with leg elevation, salt avoidance, and compression stockings.  He will let me know if not proving.  Will consider weaning off or stopping amlodipine if not improving with above conservative measures.

## 2022-04-03 NOTE — Assessment & Plan Note (Signed)
Blood pressure is at goal today.  Will continue his current regimen losartan 25 mg daily, amlodipine 10 mg daily and cardura 8 mg daily.  As above amlodipine may be contributing to lower extremity edema.  And may need to wean off at some point soon.

## 2022-04-03 NOTE — Patient Instructions (Addendum)
It was very nice to see you today!  Please start the triamcinolone.  Let me know if not improving.  Take care, Dr [name]  PLEASE NOTE:  If you had any lab tests, please let us know if you have not heard back within a few days. You may see your results on mychart before we have a chance to review them but we will give you a call once they are reviewed by [place].   If we ordered any referrals today, please let us know if you have not heard from their office within the next week.   If you had any urgent prescriptions sent in today, please check with the pharmacy within an hour of our visit to make sure the prescription was transmitted appropriately.   Please try these tips to maintain a healthy lifestyle:  Eat at least 3 REAL meals and 1-2 snacks per day.  Aim for no more than 5 hours between eating.  If you eat breakfast, please do so within one hour of getting up.   Each meal should contain half fruits/vegetables, one quarter protein, and one quarter carbs (no bigger than a computer mouse)  Cut down on sweet beverages. This includes juice, soda, and sweet tea.   Drink at least 1 glass of water with each meal and aim for at least 8 glasses per day  Exercise at least 150 minutes every week.

## 2022-04-13 ENCOUNTER — Other Ambulatory Visit: Payer: Self-pay | Admitting: Family Medicine

## 2022-05-23 IMAGING — US US ABDOMEN COMPLETE
1 series · 14 of 25 positions shown · non-contrast
Comparison: 05/12/2019

CLINICAL DATA: Status post liver transplant

EXAM:
ABDOMEN ULTRASOUND COMPLETE

[Series 1: us abdomen complete · 0.23mm/px · 14 of 111 slices shown]
[im 1/111]
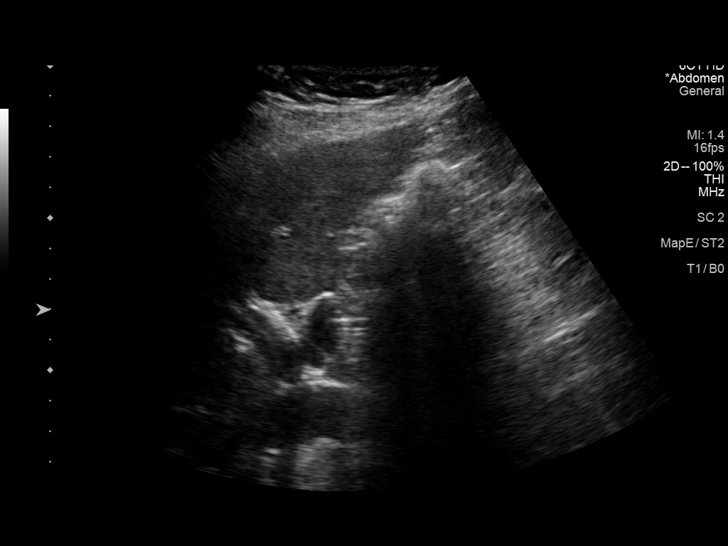
[im 10/111]
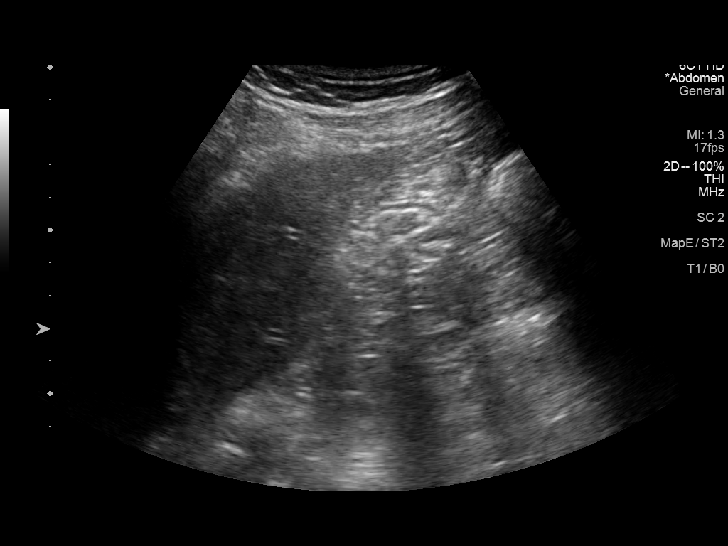
[im 19/111]
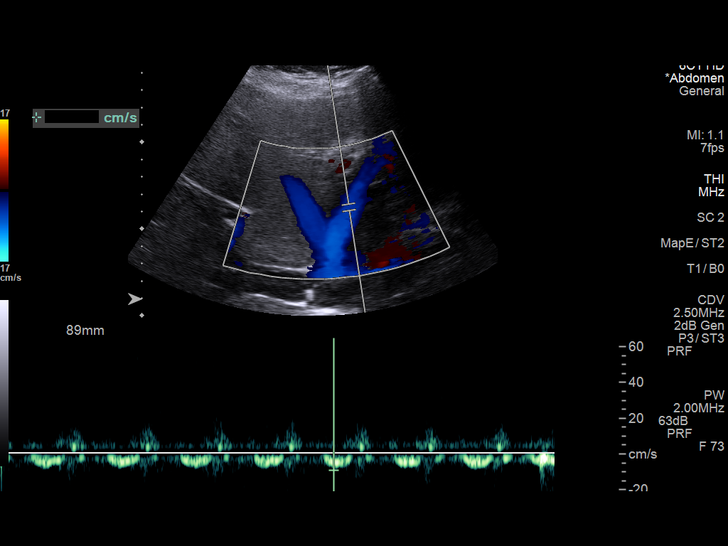
[im 28/111]
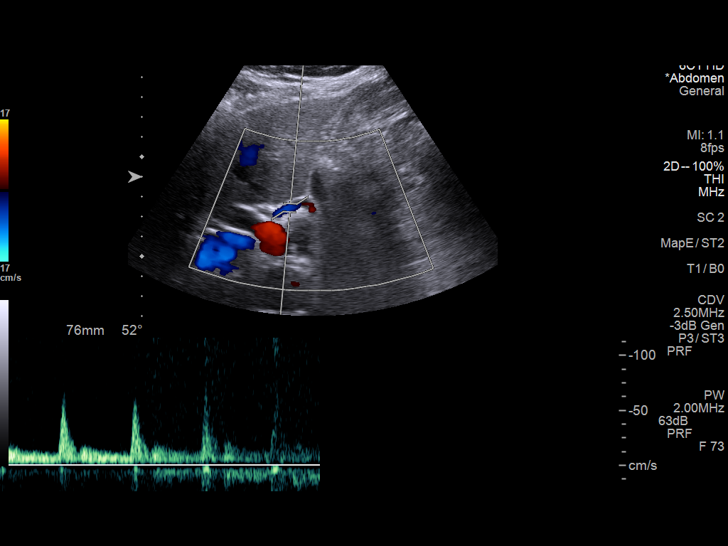
[im 37/111]
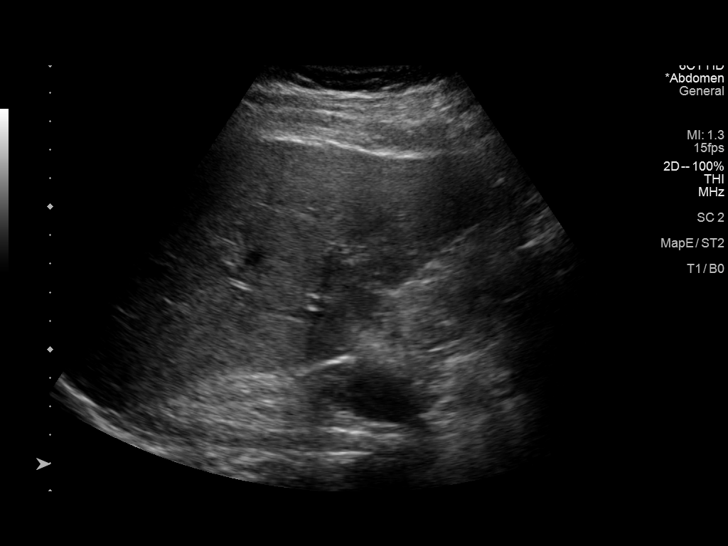
[im 42/111]
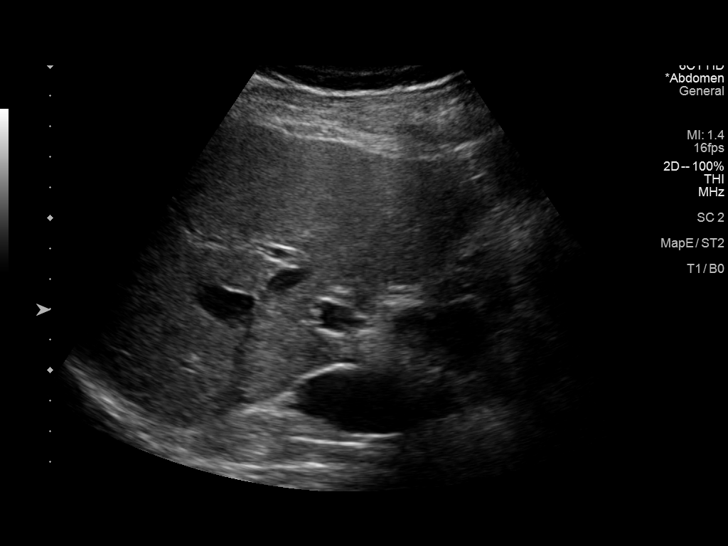
[im 51/111]
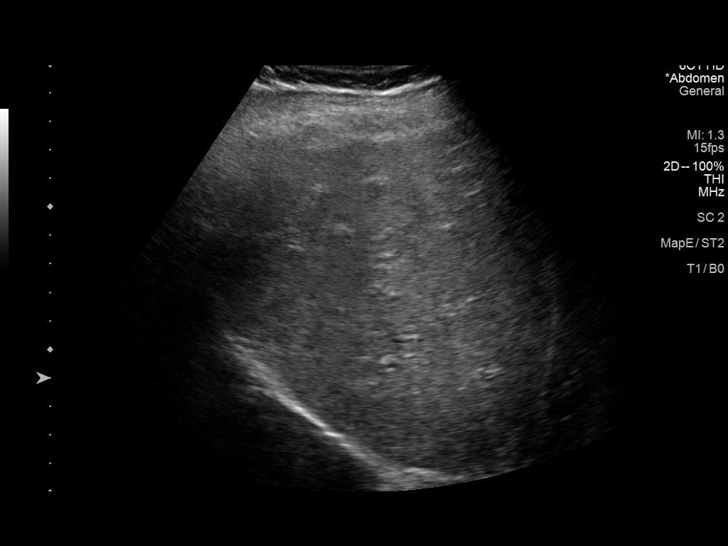
[im 60/111]
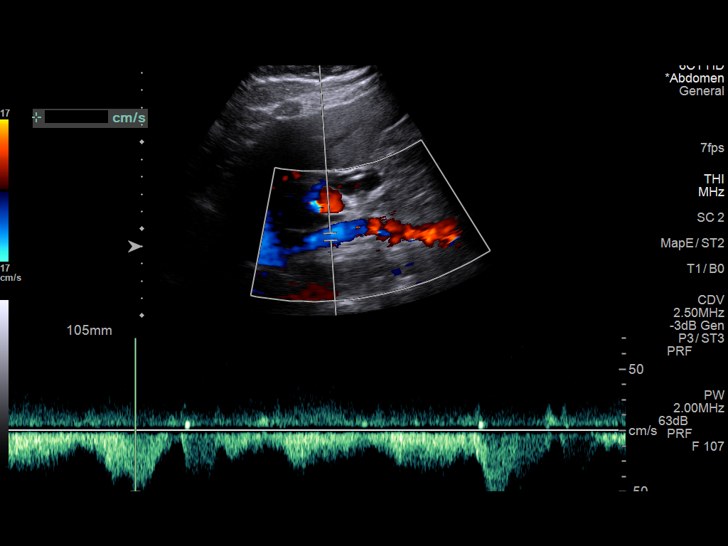
[im 69/111]
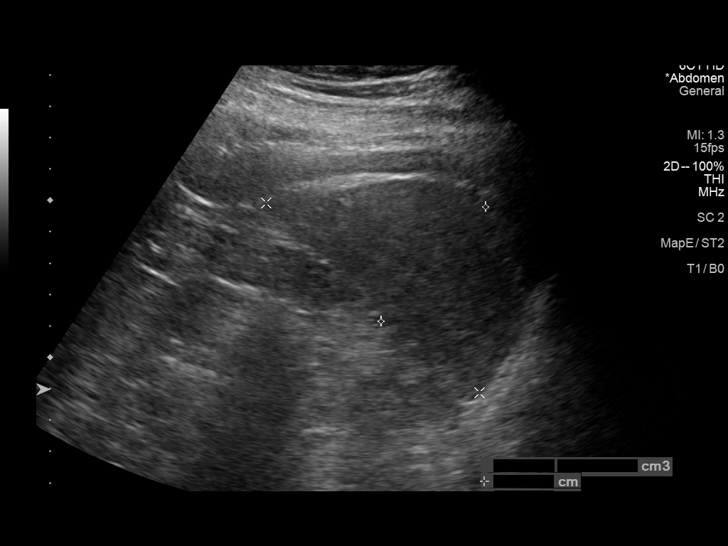
[im 74/111]
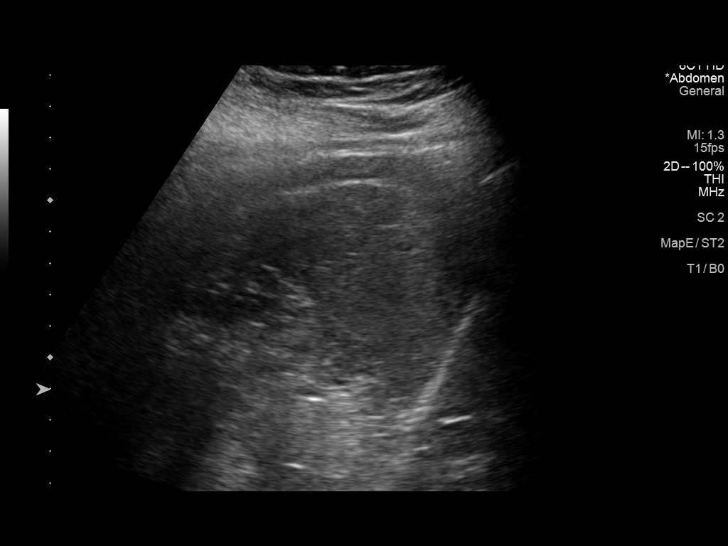
[im 83/111]
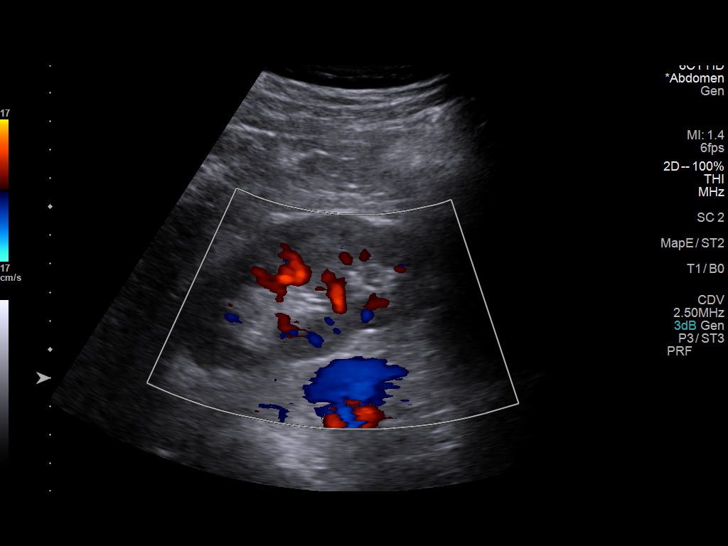
[im 92/111]
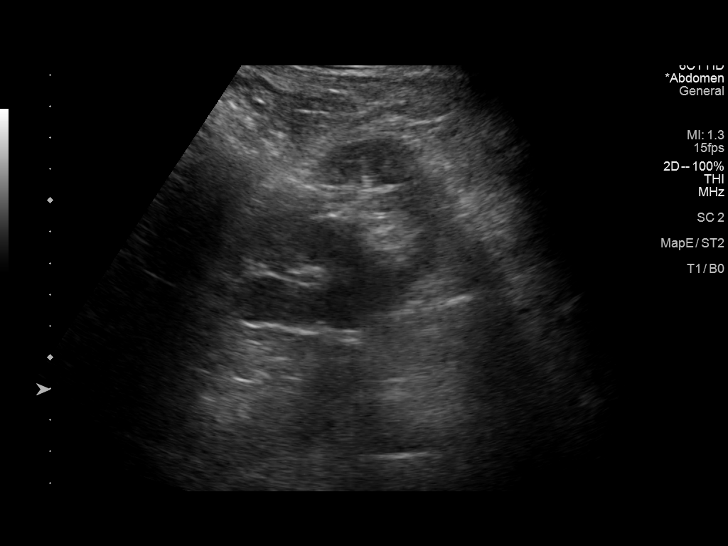
[im 101/111]
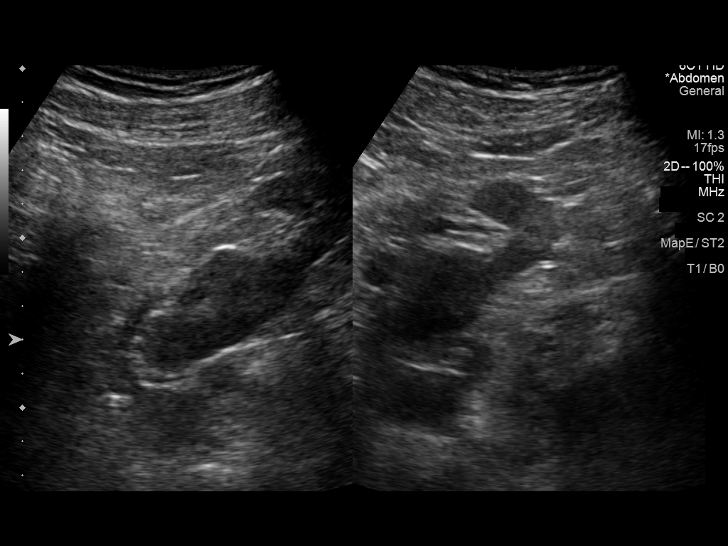
[im 111/111]
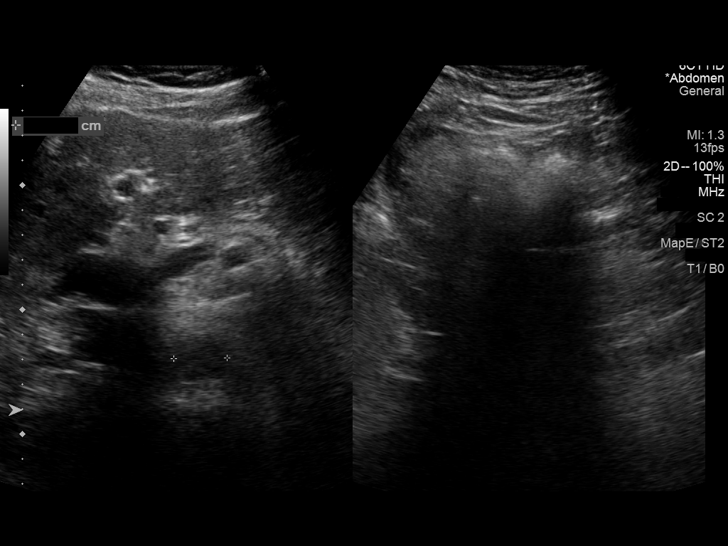

[14 of 25 positions shown; findings below may reference images not displayed]

FINDINGS: Gallbladder: Absent

Common bile duct: Diameter: 10 mm

Liver: Normal parenchymal echogenicity. No focal hepatic lesion.
Portal vein is patent on color Doppler imaging with normal direction
of blood flow towards the liver.

IVC: Limited visualization due to shadowing bowel gas.

Pancreas: Visualized portion unremarkable.

Spleen: Size and appearance within normal limits.

Right Kidney: Length: 10.9 cm. Echogenicity within normal limits. No
mass or hydronephrosis visualized.

Left Kidney: Length: 7.7 cm. Echogenicity within normal limits. No
mass or hydronephrosis visualized. Located in the left pelvis.

Abdominal aorta: No aneurysm visualized. (Limited visualization of
the distal segment) 3

Other findings: None.
IMPRESSION: No significant sonographic abnormality of the abdomen.

## 2022-06-03 ENCOUNTER — Other Ambulatory Visit: Payer: Self-pay | Admitting: Family Medicine

## 2022-06-08 ENCOUNTER — Ambulatory Visit (INDEPENDENT_AMBULATORY_CARE_PROVIDER_SITE_OTHER): Payer: Medicare HMO

## 2022-06-08 VITALS — Wt 195.0 lb

## 2022-06-08 DIAGNOSIS — Z0000 Encounter for general adult medical examination without abnormal findings: Secondary | ICD-10-CM

## 2022-06-08 NOTE — Progress Notes (Signed)
I connected with  Edwena Blow on 06/08/22 by a audio enabled telemedicine application and verified that I am speaking with the correct person using two identifiers.  Patient Location: Home  Provider Location: Office/Clinic  I discussed the limitations of evaluation and management by telemedicine. The patient expressed understanding and agreed to proceed.   Subjective:   BRYLER PESKIN is a 67 y.o. male who presents for Medicare Annual/Subsequent preventive examination.  Review of Systems     Cardiac Risk Factors include: advanced age (>85men, >62 women);obesity (BMI >30kg/m2);male gender;dyslipidemia;hypertension     Objective:    Today's Vitals   06/08/22 0853  Weight: 195 lb (88.5 kg)   Body mass index is 32.45 kg/m.     06/08/2022    8:56 AM 05/26/2021    9:00 AM 10/20/2017   12:46 AM 10/28/2014    5:42 PM  Advanced Directives  Does Patient Have a Medical Advance Directive? No No No No  Would patient like information on creating a medical advance directive? No - Patient declined No - Patient declined No - Patient declined No - patient declined information    Current Medications (verified) Outpatient Encounter Medications as of 06/08/2022  Medication Sig   amLODipine (NORVASC) 10 MG tablet TAKE 1 TABLET(10 MG) BY MOUTH DAILY   aspirin EC 81 MG tablet Take 1 tablet (81 mg total) by mouth daily.   atorvastatin (LIPITOR) 40 MG tablet TAKE 1 TABLET(40 MG) BY MOUTH DAILY   doxazosin (CARDURA) 8 MG tablet TAKE 1 TABLET(8 MG) BY MOUTH DAILY   K Phos Mono-Sod Phos Di & Mono (VIRT-PHOS 250 NEUTRAL) [phone] MG TABS Take 2 tablets by mouth daily.   losartan (COZAAR) 25 MG tablet Take 25 mg by mouth daily.   magnesium oxide (MAG-OX) 400 MG tablet Take 400 mg by mouth daily.   mycophenolate (CELLCEPT) 500 MG tablet    mycophenolate (MYFORTIC) 360 MG TBEC EC tablet TAKE 2 TABLET BY MOUTH TWICE DAILY   omeprazole (PRILOSEC) 20 MG capsule Take 20 mg by mouth daily.    sulfamethoxazole-trimethoprim (BACTRIM) 400-80 MG tablet Take 1 tablet by mouth 3 (three) times a week.   tacrolimus (PROGRAF) 1 MG capsule Take 4 capsules (4 mg total) by mouth 2 (two) times daily. Take 4 capsules in the morning and 4 capsules in the evening   tadalafil (CIALIS) 10 MG tablet TAKE 1 TABLET(10 MG) BY MOUTH DAILY AS NEEDED FOR ERECTILE DYSFUNCTION   triamcinolone ointment (KENALOG) 0.5 % Apply 1 Application topically 2 (two) times daily.   No facility-administered encounter medications on file as of 06/08/2022.    Allergies (verified) Patient has no known allergies.   History: Past Medical History:  Diagnosis Date   Acute renal injury (HCC)    Blood transfusion without reported diagnosis    Chronic kidney disease, stage III (moderate) (HCC)    Cirrhosis of liver (HCC)    GERD (gastroesophageal reflux disease)    Hypertension    Hypomagnesemia    Hypophosphatemia    Liver failure (HCC)    Pyorrhea    S/P liver transplant (HCC) 11/12/2014   @ [facility], [facility] and [facility]gra[facility]   Past Surgical History:  Procedure Laterality Date   ABDOMINAL SURGERY     COLONOSCOPY     ?2009   LIVER TRANSPLANT  11/12/2014   [facility]   Family History  Problem Relation Age of Onset   Hypertension Brother    Colon cancer Neg Hx    Colon polyps Neg Hx  Esophageal cancer Neg Hx    Rectal cancer Neg Hx    Stomach cancer Neg Hx    Social History   Socioeconomic History   Marital status: Married    Spouse name: Not on file   Number of children: 3   Years of education: 12   Highest education level: Not on file  Occupational History   Occupation: Warehouse  Tobacco Use   Smoking status: Never   Smokeless tobacco: Never  Vaping Use   Vaping Use: Never used  Substance and Sexual Activity   Alcohol use: No   Drug use: No   Sexual activity: Not on file  Other Topics Concern   Not on file  Social History Narrative   Fun: Photographer    Social Determinants of Health   Financial  Resource Strain: Low Risk  (06/08/2022)   Overall Financial Resource Strain (CARDIA)    Difficulty of Paying Living Expenses: Not hard at all  Food Insecurity: No Food Insecurity (06/08/2022)   Hunger Vital Sign    Worried About Running Out of Food in the Last Year: Never true    Ran Out of Food in the Last Year: Never true  Transportation Needs: No Transportation Needs (06/08/2022)   PRAPARE - Administrator, Civil Service (Medical): No    Lack of Transportation (Non-Medical): No  Physical Activity: Insufficiently Active (06/08/2022)   Exercise Vital Sign    Days of Exercise per Week: 2 days    Minutes of Exercise per Session: 20 min  Stress: No Stress Concern Present (06/08/2022)   [employer] of Occupational Health - Occupational Stress Questionnaire    Feeling of Stress : Not at all  Social Connections: Moderately Isolated (06/08/2022)   Social Connection and Isolation Panel [NHANES]    Frequency of Communication with Friends and Family: Twice a week    Frequency of Social Gatherings with Friends and Family: More than three times a week    Attends Religious Services: Never    Database administrator or Organizations: No    Attends Engineer, structural: Never    Marital Status: Married    Tobacco Counseling Counseling given: Not Answered   Clinical Intake:  Pre-visit preparation completed: Yes  Pain : No/denies pain     BMI - recorded: 32.45 Nutritional Status: BMI > 30  Obese Nutritional Risks: None Diabetes: No  How often do you need to have someone help you when you read instructions, pamphlets, or other written materials from your doctor or pharmacy?: 1 - Never  Diabetic?no  Interpreter Needed?: No  Information entered by :: Lanier Ensign, LPN   Activities of Daily Living    06/08/2022    8:57 AM  In your present state of health, do you have any difficulty performing the following activities:  Hearing? 0  Vision? 0  Difficulty  concentrating or making decisions? 0  Walking or climbing stairs? 0  Dressing or bathing? 0  Doing errands, shopping? 0  Preparing Food and eating ? N  Using the Toilet? N  In the past six months, have you accidently leaked urine? N  Do you have problems with loss of bowel control? N  Managing your Medications? N  Managing your Finances? N  Housekeeping or managing your Housekeeping? N    Patient Care Team: Ardith Dark, MD as PCP - General (Family Medicine) Tyler Pita, MD as Consulting Physician (Nephrology) Delemos, Reola Mosher, MD as Consulting Physician (Gastroenterology)  Indicate  any recent Medical Services you may have received from other than [facility] providers in the past year (date may be approximate).     Assessment:   This is a routine wellness examination for Asuncion.  Hearing/Vision screen Hearing Screening - Comments:: Pt denies any hearing issues  Vision Screening - Comments:: Encourage to follow up with provider   Dietary issues and exercise activities discussed: Current Exercise Habits: Home exercise routine, Type of exercise: walking, Time (Minutes): 20, Frequency (Times/Week): 2, Weekly Exercise (Minutes/Week): 40   Goals Addressed             This Visit's Progress    Patient Stated       None at this time        Depression Screen    06/08/2022    8:55 AM 08/12/2021    2:42 PM 05/26/2021    8:59 AM 01/27/2021    2:23 PM 12/06/2018    2:28 PM 06/22/2017   11:11 AM 02/05/2017   10:51 AM  PHQ 2/9 Scores  PHQ - 2 Score 0 0 0 0 0 0 0    Fall Risk    06/08/2022    8:56 AM 04/03/2022    1:38 PM 08/12/2021    2:42 PM 05/26/2021    9:01 AM 01/27/2021    2:22 PM  Fall Risk   Falls in the past year? 0 0 0 0 0  Number falls in past yr: 0 0 0 0 0  Injury with Fall? 0 0 0 0 0  Risk for fall due to : No Fall Risks No Fall Risks No Fall Risks    Follow up Falls prevention discussed Falls evaluation completed  Falls prevention discussed     FALL RISK  PREVENTION PERTAINING TO THE HOME:  Any stairs in or around the home? No  If so, are there any without handrails? No  Home free of loose throw rugs in walkways, pet beds, electrical cords, etc? Yes  Adequate lighting in your home to reduce risk of falls? Yes   ASSISTIVE DEVICES UTILIZED TO PREVENT FALLS:  Life alert? No  Use of a cane, walker or w/c? No  Grab bars in the bathroom? No  Shower chair or bench in shower? No  Elevated toilet seat or a handicapped toilet? No   TIMED UP AND GO:  Was the test performed? No .   Cognitive Function:        06/08/2022    8:57 AM 05/26/2021    9:02 AM  6CIT Screen  What Year? 0 points 0 points  What month? 0 points 0 points  What time? 0 points 0 points  Count back from 20 0 points 0 points  Months in reverse 0 points 0 points  Repeat phrase 0 points 6 points  Total Score 0 points 6 points    Immunizations Immunization History  Administered Date(s) Administered   Fluad Quad(high Dose 65+) 12/06/2018, 11/07/2019, 01/26/2021   Influenza,inj,Quad PF,6+ Mos 11/17/2016   PFIZER(Purple Top)SARS-COV-2 Vaccination 06/06/2019, 06/26/2019   Pneumococcal Conjugate-13 12/06/2018   Zoster Recombinat (Shingrix) 03/25/2021    TDAP status: Due, Education has been provided regarding the importance of this vaccine. Advised may receive this vaccine at local pharmacy or Health Dept. Aware to provide a copy of the vaccination record if obtained from local pharmacy or Health Dept. Verbalized acceptance and understanding.  Flu Vaccine status: Up to date  Pneumococcal vaccine status: Due, Education has been provided regarding the importance of this vaccine. Advised  may receive this vaccine at local pharmacy or Health Dept. Aware to provide a copy of the vaccination record if obtained from local pharmacy or Health Dept. Verbalized acceptance and understanding.  Covid-19 vaccine status: Completed vaccines  Qualifies for Shingles Vaccine? Yes   Zostavax  completed Yes   Shingrix Completed?: No.    Education has been provided regarding the importance of this vaccine. Patient has been advised to call insurance company to determine out of pocket expense if they have not yet received this vaccine. Advised may also receive vaccine at local pharmacy or Health Dept. Verbalized acceptance and understanding.  Screening Tests Health Maintenance  Topic Date Due   DTaP/Tdap/Td (1 - Tdap) Never done   Pneumonia Vaccine 9+ Years old (2 of 2 - PPSV23 or PCV20) 01/31/2019   COVID-19 Vaccine (3 - Pfizer risk series) 07/24/2019   Zoster Vaccines- Shingrix (2 of 2) 05/20/2021   INFLUENZA VACCINE  08/20/2022   Medicare Annual Wellness (AWV)  06/08/2023   COLONOSCOPY (Pts 45-5yrs Insurance coverage will need to be confirmed)  07/11/2026   Hepatitis C Screening  Completed   HPV VACCINES  Aged Out    Health Maintenance  Health Maintenance Due  Topic Date Due   DTaP/Tdap/Td (1 - Tdap) Never done   Pneumonia Vaccine 81+ Years old (2 of 2 - PPSV23 or PCV20) 01/31/2019   COVID-19 Vaccine (3 - Pfizer risk series) 07/24/2019   Zoster Vaccines- Shingrix (2 of 2) 05/20/2021    Colorectal cancer screening: Type of screening: Colonoscopy. Completed 07/11/19. Repeat every 7 years   Additional Screening:  Hepatitis C Screening:  Completed 10/28/14  Vision Screening: Recommended annual ophthalmology exams for early detection of glaucoma and other disorders of the eye. Is the patient up to date with their annual eye exam?  No  Who is the provider or what is the name of the office in which the patient attends annual eye exams? Encouraged to follow up  If pt is not established with a provider, would they like to be referred to a provider to establish care? Yes .   Dental Screening: Recommended annual dental exams for proper oral hygiene  Community Resource Referral / Chronic Care Management: CRR required this visit?  No   CCM required this visit?  No       Plan:     I have personally reviewed and noted the following in the patient's chart:   Medical and social history Use of alcohol, tobacco or illicit drugs  Current medications and supplements including opioid prescriptions. Patient is not currently taking opioid prescriptions. Functional ability and status Nutritional status Physical activity Advanced directives List of other physicians Hospitalizations, surgeries, and ER visits in previous 12 months Vitals Screenings to include cognitive, depression, and falls Referrals and appointments  In addition, I have reviewed and discussed with patient certain preventive protocols, quality metrics, and best practice recommendations. A written personalized care plan for preventive services as well as general preventive health recommendations were provided to patient.     Marzella Schlein, LPN   [id number]/61/[id number]   Nurse Notes: none

## 2022-06-08 NOTE — Patient Instructions (Signed)
Bruce Conner , Thank you for taking time to come for your Medicare Wellness Visit. I appreciate your ongoing commitment to your health goals. Please review the following plan we discussed and let me know if I can assist you in the future.   These are the goals we discussed:  Goals      Patient Stated     Exercise more      Patient Stated     None at this time         This is a list of the screening recommended for you and due dates:  Health Maintenance  Topic Date Due   DTaP/Tdap/Td vaccine (1 - Tdap) Never done   Pneumonia Vaccine (2 of 2 - PPSV23 or PCV20) 01/31/2019   COVID-19 Vaccine (3 - Pfizer risk series) 07/24/2019   Zoster (Shingles) Vaccine (2 of 2) 05/20/2021   Flu Shot  08/20/2022   Medicare Annual Wellness Visit  06/08/2023   Colon Cancer Screening  07/11/2026   Hepatitis C Screening: USPSTF Recommendation to screen - Ages 18-79 yo.  Completed   HPV Vaccine  Aged Out    Advanced directives: Please bring a copy of your health care power of attorney and living will to the office at your convenience.  Conditions/risks identified: none at this time   Next appointment: Follow up in one year for your annual wellness visit.   Preventive Care 75 Years and Older, Male  Preventive care refers to lifestyle choices and visits with your health care provider that can promote health and wellness. What does preventive care include? A yearly physical exam. This is also called an annual well check. Dental exams once or twice a year. Routine eye exams. Ask your health care provider how often you should have your eyes checked. Personal lifestyle choices, including: Daily care of your teeth and gums. Regular physical activity. Eating a healthy diet. Avoiding tobacco and drug use. Limiting alcohol use. Practicing safe sex. Taking low doses of aspirin every day. Taking vitamin and mineral supplements as recommended by your health care provider. What happens during an annual  well check? The services and screenings done by your health care provider during your annual well check will depend on your age, overall health, lifestyle risk factors, and family history of disease. Counseling  Your health care provider may ask you questions about your: Alcohol use. Tobacco use. Drug use. Emotional well-being. Home and relationship well-being. Sexual activity. Eating habits. History of falls. Memory and ability to understand (cognition). Work and work Astronomer. Screening  You may have the following tests or measurements: Height, weight, and BMI. Blood pressure. Lipid and cholesterol levels. These may be checked every 5 years, or more frequently if you are over 79 years old. Skin check. Lung cancer screening. You may have this screening every year starting at age 73 if you have a 30-pack-year history of smoking and currently smoke or have quit within the past 15 years. Fecal occult blood test (FOBT) of the stool. You may have this test every year starting at age 84. Flexible sigmoidoscopy or colonoscopy. You may have a sigmoidoscopy every 5 years or a colonoscopy every 10 years starting at age 1. Prostate cancer screening. Recommendations will vary depending on your family history and other risks. Hepatitis C blood test. Hepatitis B blood test. Sexually transmitted disease (STD) testing. Diabetes screening. This is done by checking your blood sugar (glucose) after you have not eaten for a while (fasting). You may have this done every  1-3 years. Abdominal aortic aneurysm (AAA) screening. You may need this if you are a current or former smoker. Osteoporosis. You may be screened starting at age 19 if you are at high risk. Talk with your health care provider about your test results, treatment options, and if necessary, the need for more tests. Vaccines  Your health care provider may recommend certain vaccines, such as: Influenza vaccine. This is recommended every  year. Tetanus, diphtheria, and acellular pertussis (Tdap, Td) vaccine. You may need a Td booster every 10 years. Zoster vaccine. You may need this after age 2. Pneumococcal 13-valent conjugate (PCV13) vaccine. One dose is recommended after age 28. Pneumococcal polysaccharide (PPSV23) vaccine. One dose is recommended after age 44. Talk to your health care provider about which screenings and vaccines you need and how often you need them. This information is not intended to replace advice given to you by your health care provider. Make sure you discuss any questions you have with your health care provider. Document Released: 02/01/2015 Document Revised: 09/25/2015 Document Reviewed: 11/06/2014 Elsevier Interactive Patient Education  2017 [employer].  Fall Prevention in the Home Falls can cause injuries. They can happen to people of all ages. There are many things you can do to make your home safe and to help prevent falls. What can I do on the outside of my home? Regularly fix the edges of walkways and driveways and fix any cracks. Remove anything that might make you trip as you walk through a door, such as a raised step or threshold. Trim any bushes or trees on the path to your home. Use bright outdoor lighting. Clear any walking paths of anything that might make someone trip, such as rocks or tools. Regularly check to see if handrails are loose or broken. Make sure that both sides of any steps have handrails. Any raised decks and porches should have guardrails on the edges. Have any leaves, snow, or ice cleared regularly. Use sand or salt on walking paths during winter. Clean up any spills in your garage right away. This includes oil or grease spills. What can I do in the bathroom? Use night lights. Install grab bars by the toilet and in the tub and shower. Do not use towel bars as grab bars. Use non-skid mats or decals in the tub or shower. If you need to sit down in the shower, use a  plastic, non-slip stool. Keep the floor dry. Clean up any water that spills on the floor as soon as it happens. Remove soap buildup in the tub or shower regularly. Attach bath mats securely with double-sided non-slip rug tape. Do not have throw rugs and other things on the floor that can make you trip. What can I do in the bedroom? Use night lights. Make sure that you have a light by your bed that is easy to reach. Do not use any sheets or blankets that are too big for your bed. They should not hang down onto the floor. Have a firm chair that has side arms. You can use this for support while you get dressed. Do not have throw rugs and other things on the floor that can make you trip. What can I do in the kitchen? Clean up any spills right away. Avoid walking on wet floors. Keep items that you use a lot in easy-to-reach places. If you need to reach something above you, use a strong step stool that has a grab bar. Keep electrical cords out of the way.  Do not use floor polish or wax that makes floors slippery. If you must use wax, use non-skid floor wax. Do not have throw rugs and other things on the floor that can make you trip. What can I do with my stairs? Do not leave any items on the stairs. Make sure that there are handrails on both sides of the stairs and use them. Fix handrails that are broken or loose. Make sure that handrails are as long as the stairways. Check any carpeting to make sure that it is firmly attached to the stairs. Fix any carpet that is loose or worn. Avoid having throw rugs at the top or bottom of the stairs. If you do have throw rugs, attach them to the floor with carpet tape. Make sure that you have a light switch at the top of the stairs and the bottom of the stairs. If you do not have them, ask someone to add them for you. What else can I do to help prevent falls? Wear shoes that: Do not have high heels. Have rubber bottoms. Are comfortable and fit you  well. Are closed at the toe. Do not wear sandals. If you use a stepladder: Make sure that it is fully opened. Do not climb a closed stepladder. Make sure that both sides of the stepladder are locked into place. Ask someone to hold it for you, if possible. Clearly mark and make sure that you can see: Any grab bars or handrails. First and last steps. Where the edge of each step is. Use tools that help you move around (mobility aids) if they are needed. These include: Canes. Walkers. Scooters. Crutches. Turn on the lights when you go into a dark area. Replace any light bulbs as soon as they burn out. Set up your furniture so you have a clear path. Avoid moving your furniture around. If any of your floors are uneven, fix them. If there are any pets around you, be aware of where they are. Review your medicines with your doctor. Some medicines can make you feel dizzy. This can increase your chance of falling. Ask your doctor what other things that you can do to help prevent falls. This information is not intended to replace advice given to you by your health care provider. Make sure you discuss any questions you have with your health care provider. Document Released: 11/01/2008 Document Revised: 06/13/2015 Document Reviewed: 02/09/2014 Elsevier Interactive Patient Education  2017 [employer].

## 2022-08-25 ENCOUNTER — Other Ambulatory Visit: Payer: Self-pay | Admitting: Family Medicine

## 2022-09-04 ENCOUNTER — Other Ambulatory Visit: Payer: Self-pay | Admitting: Family Medicine

## 2022-10-21 ENCOUNTER — Encounter: Payer: Self-pay | Admitting: Family Medicine

## 2022-10-21 ENCOUNTER — Ambulatory Visit (INDEPENDENT_AMBULATORY_CARE_PROVIDER_SITE_OTHER): Payer: Medicare HMO | Admitting: Family Medicine

## 2022-10-21 VITALS — BP 133/78 | HR 68 | Temp 98.2°F | Ht 65.0 in | Wt 198.4 lb

## 2022-10-21 DIAGNOSIS — N529 Male erectile dysfunction, unspecified: Secondary | ICD-10-CM

## 2022-10-21 DIAGNOSIS — N183 Chronic kidney disease, stage 3 unspecified: Secondary | ICD-10-CM

## 2022-10-21 DIAGNOSIS — I10 Essential (primary) hypertension: Secondary | ICD-10-CM | POA: Diagnosis not present

## 2022-10-21 DIAGNOSIS — R739 Hyperglycemia, unspecified: Secondary | ICD-10-CM

## 2022-10-21 DIAGNOSIS — E785 Hyperlipidemia, unspecified: Secondary | ICD-10-CM | POA: Diagnosis not present

## 2022-10-21 DIAGNOSIS — Z23 Encounter for immunization: Secondary | ICD-10-CM

## 2022-10-21 LAB — LIPID PANEL
Cholesterol: 129 mg/dL (ref 0–200)
HDL: 34.6 mg/dL — ABNORMAL LOW (ref >39.00)
LDL Cholesterol: 73 mg/dL (ref 0–99)
NonHDL: 94.18
Total CHOL/HDL Ratio: 4
Triglycerides: 104 mg/dL (ref 0.0–149.0)
VLDL: 20.8 mg/dL (ref 0.0–40.0)

## 2022-10-21 LAB — COMPREHENSIVE METABOLIC PANEL
ALT: 24 U/L (ref 0–53)
AST: 35 U/L (ref 0–37)
Albumin: 4.2 g/dL (ref 3.5–5.2)
Alkaline Phosphatase: 98 U/L (ref 39–117)
BUN: 16 mg/dL (ref 6–23)
CO2: 25 meq/L (ref 19–32)
Calcium: 9.3 mg/dL (ref 8.4–10.5)
Chloride: 108 meq/L (ref 96–112)
Creatinine, Ser: 1.29 mg/dL (ref 0.40–1.50)
GFR: 56.82 mL/min — ABNORMAL LOW (ref >60.00)
Glucose, Bld: 93 mg/dL (ref 70–99)
Potassium: 3.6 meq/L (ref 3.5–5.1)
Sodium: 142 meq/L (ref 135–145)
Total Bilirubin: 0.6 mg/dL (ref 0.2–1.2)
Total Protein: 7 g/dL (ref 6.0–8.3)

## 2022-10-21 LAB — HEMOGLOBIN A1C: Hgb A1c MFr Bld: 5.2 % (ref 4.6–6.5)

## 2022-10-21 LAB — TSH: TSH: 1.56 u[IU]/mL (ref 0.35–5.50)

## 2022-10-21 LAB — CBC
HCT: 39.2 % (ref 39.0–52.0)
Hemoglobin: 12.5 g/dL — ABNORMAL LOW (ref 13.0–17.0)
MCHC: 31.8 g/dL (ref 30.0–36.0)
MCV: 89.2 fL (ref 78.0–100.0)
Platelets: 140 10*3/uL — ABNORMAL LOW (ref 150.0–400.0)
RBC: 4.4 Mil/uL (ref 4.22–5.81)
RDW: 14.4 % (ref 11.5–15.5)
WBC: 4.1 10*3/uL (ref 4.0–10.5)

## 2022-10-21 MED ORDER — TADALAFIL 10 MG PO TABS: ORAL_TABLET | ORAL | 5 refills | Status: AC

## 2022-10-21 MED ORDER — ATORVASTATIN CALCIUM 40 MG PO TABS: 40.0000 mg | ORAL_TABLET | Freq: Every day | ORAL | 3 refills | Status: DC

## 2022-10-21 NOTE — Progress Notes (Signed)
   Bruce Conner is a 67 y.o. male who presents today for an office visit.  Assessment/Plan:  Chronic Problems Addressed Today: Dyslipidemia On Lipitor 40 mg daily.  Will refill today.  Check lipids.  Essential hypertension Blood pressure at goal today on losartan 25 mg daily, amlodipine 10 mg daily, and Cardura 8 mg daily.  Check labs today.  Erectile dysfunction Stable on Cialis 10 mg daily as needed.  CKD (chronic kidney disease) stage 3, GFR 30-59 ml/min (HCC) Following with nephrology.  We are avoiding NSAIDs.  Check c-Met today.  Preventative health care Check labs.  Flu shot given today.    Subjective:  HPI:  See Assessment / plan for status of chronic conditions. He is here today for medication refills.  Doing well.  No acute concerns.       Objective:  Physical Exam: BP 133/78   Pulse 68   Temp 98.2 F (36.8 C) (Temporal)   Ht 5\' 5"  (1.651 m)   Wt 198 lb 6.4 oz (90 kg)   SpO2 97%   BMI 33.02 kg/m   Gen: No acute distress, resting comfortably CV: Regular rate and rhythm with no murmurs appreciated Pulm: Normal work of breathing, clear to auscultation bilaterally with no crackles, wheezes, or rhonchi Neuro: Grossly normal, moves all extremities Psych: Normal affect and thought content      Magdalene Tardiff M. Jimmey Ralph, MD 10/21/2022 8:46 AM

## 2022-10-21 NOTE — Assessment & Plan Note (Signed)
Blood pressure at goal today on losartan 25 mg daily, amlodipine 10 mg daily, and Cardura 8 mg daily.  Check labs today.

## 2022-10-21 NOTE — Assessment & Plan Note (Signed)
On Lipitor 40 mg daily.  Will refill today.  Check lipids.

## 2022-10-21 NOTE — Assessment & Plan Note (Signed)
Following with nephrology.  We are avoiding NSAIDs.  Check c-Met today.

## 2022-10-21 NOTE — Patient Instructions (Addendum)
It was very nice to see you today!  I will refill your medications today.  Will check blood work.  Will get your flu shot today.  Return in about 1 year (around 10/21/2023) for Annual Physical.   Take care, Dr Jimmey Ralph  PLEASE NOTE:  If you had any lab tests, please let us know if you have not heard back within a few days. You may see your results on mychart before we have a chance to review them but we will give you a call once they are reviewed by [place].   If we ordered any referrals today, please let us know if you have not heard from their office within the next week.   If you had any urgent prescriptions sent in today, please check with the pharmacy within an hour of our visit to make sure the prescription was transmitted appropriately.   Please try these tips to maintain a healthy lifestyle:  Eat at least 3 REAL meals and 1-2 snacks per day.  Aim for no more than 5 hours between eating.  If you eat breakfast, please do so within one hour of getting up.   Each meal should contain half fruits/vegetables, one quarter protein, and one quarter carbs (no bigger than a computer mouse)  Cut down on sweet beverages. This includes juice, soda, and sweet tea.   Drink at least 1 glass of water with each meal and aim for at least 8 glasses per day  Exercise at least 150 minutes every week.

## 2022-10-21 NOTE — Assessment & Plan Note (Signed)
Stable on Cialis 10 mg daily as needed.

## 2022-10-23 NOTE — Progress Notes (Signed)
Labs are all stable.  Cholesterol much better than last year.  Do not need to make any changes to treatment plan at this time.  He should continue to work on diet and exercise and we can recheck everything in a year.

## 2022-11-16 DIAGNOSIS — M85852 Other specified disorders of bone density and structure, left thigh: Secondary | ICD-10-CM | POA: Diagnosis not present

## 2022-11-16 DIAGNOSIS — Z944 Liver transplant status: Secondary | ICD-10-CM | POA: Diagnosis not present

## 2022-11-16 DIAGNOSIS — D849 Immunodeficiency, unspecified: Secondary | ICD-10-CM | POA: Diagnosis not present

## 2022-11-16 DIAGNOSIS — M85851 Other specified disorders of bone density and structure, right thigh: Secondary | ICD-10-CM | POA: Diagnosis not present

## 2022-11-18 DIAGNOSIS — M85852 Other specified disorders of bone density and structure, left thigh: Secondary | ICD-10-CM | POA: Diagnosis not present

## 2022-11-18 DIAGNOSIS — D849 Immunodeficiency, unspecified: Secondary | ICD-10-CM | POA: Diagnosis not present

## 2022-11-18 DIAGNOSIS — Z944 Liver transplant status: Secondary | ICD-10-CM | POA: Diagnosis not present

## 2022-11-18 DIAGNOSIS — M85851 Other specified disorders of bone density and structure, right thigh: Secondary | ICD-10-CM | POA: Diagnosis not present

## 2022-12-02 DIAGNOSIS — Z944 Liver transplant status: Secondary | ICD-10-CM | POA: Diagnosis not present

## 2022-12-02 DIAGNOSIS — D849 Immunodeficiency, unspecified: Secondary | ICD-10-CM | POA: Diagnosis not present

## 2022-12-08 ENCOUNTER — Other Ambulatory Visit: Payer: Self-pay | Admitting: Family Medicine

## 2022-12-09 ENCOUNTER — Telehealth: Payer: Self-pay | Admitting: Family Medicine

## 2022-12-09 NOTE — Telephone Encounter (Signed)
Rx send to [facility] on 12/08/2022

## 2022-12-09 NOTE — Telephone Encounter (Signed)
Prescription Request  12/09/2022  LOV: 10/21/2022  What is the name of the medication or equipment?  doxazosin (CARDURA) 8 MG tablet   Have you contacted your pharmacy to request a refill? No   Which pharmacy would you like this sent to? [id number] [place], [place] - [phone] AT [facility] OF [facility] & [address] [place] [address] Phone: [phone] Fax: [phone]   Patient notified that their request is being sent to the clinical staff for review and that they should receive a response within 2 business days.   Please advise at Mobile [phone] (mobile)

## 2022-12-28 ENCOUNTER — Telehealth: Payer: Self-pay | Admitting: Family Medicine

## 2022-12-28 ENCOUNTER — Emergency Department (HOSPITAL_COMMUNITY)
Admission: EM | Admit: 2022-12-28 | Discharge: 2022-12-28 | Disposition: A | Discharge status: Home / Self Care | Payer: Medicare HMO | Attending: Emergency Medicine | Admitting: Emergency Medicine

## 2022-12-28 ENCOUNTER — Emergency Department (HOSPITAL_COMMUNITY): Payer: Medicare HMO

## 2022-12-28 ENCOUNTER — Encounter (HOSPITAL_COMMUNITY): Payer: Self-pay | Admitting: Emergency Medicine

## 2022-12-28 ENCOUNTER — Other Ambulatory Visit: Payer: Self-pay

## 2022-12-28 DIAGNOSIS — K760 Fatty (change of) liver, not elsewhere classified: Secondary | ICD-10-CM | POA: Diagnosis not present

## 2022-12-28 DIAGNOSIS — D649 Anemia, unspecified: Secondary | ICD-10-CM | POA: Diagnosis not present

## 2022-12-28 DIAGNOSIS — I129 Hypertensive chronic kidney disease with stage 1 through stage 4 chronic kidney disease, or unspecified chronic kidney disease: Secondary | ICD-10-CM | POA: Insufficient documentation

## 2022-12-28 DIAGNOSIS — Z7982 Long term (current) use of aspirin: Secondary | ICD-10-CM | POA: Insufficient documentation

## 2022-12-28 DIAGNOSIS — K859 Acute pancreatitis without necrosis or infection, unspecified: Secondary | ICD-10-CM | POA: Diagnosis not present

## 2022-12-28 DIAGNOSIS — R0602 Shortness of breath: Secondary | ICD-10-CM | POA: Diagnosis not present

## 2022-12-28 DIAGNOSIS — Z9049 Acquired absence of other specified parts of digestive tract: Secondary | ICD-10-CM | POA: Diagnosis not present

## 2022-12-28 DIAGNOSIS — N189 Chronic kidney disease, unspecified: Secondary | ICD-10-CM | POA: Diagnosis not present

## 2022-12-28 DIAGNOSIS — R1013 Epigastric pain: Secondary | ICD-10-CM | POA: Diagnosis not present

## 2022-12-28 DIAGNOSIS — Z79899 Other long term (current) drug therapy: Secondary | ICD-10-CM | POA: Diagnosis not present

## 2022-12-28 DIAGNOSIS — R1010 Upper abdominal pain, unspecified: Secondary | ICD-10-CM | POA: Diagnosis not present

## 2022-12-28 DIAGNOSIS — K5900 Constipation, unspecified: Secondary | ICD-10-CM | POA: Insufficient documentation

## 2022-12-28 LAB — COMPREHENSIVE METABOLIC PANEL
ALT: 25 U/L (ref 0–44)
AST: 27 U/L (ref 15–41)
Albumin: 3.9 g/dL (ref 3.5–5.0)
Alkaline Phosphatase: 97 U/L (ref 38–126)
Anion gap: 9 (ref 5–15)
BUN: 18 mg/dL (ref 8–23)
CO2: 22 mmol/L (ref 22–32)
Calcium: 9.1 mg/dL (ref 8.9–10.3)
Chloride: 107 mmol/L (ref 98–111)
Creatinine, Ser: 1.32 mg/dL — ABNORMAL HIGH (ref 0.61–1.24)
GFR, Estimated: 58 mL/min — ABNORMAL LOW (ref >60)
Glucose, Bld: 98 mg/dL (ref 70–99)
Potassium: 4.3 mmol/L (ref 3.5–5.1)
Sodium: 138 mmol/L (ref 135–145)
Total Bilirubin: 0.7 mg/dL (ref <1.2)
Total Protein: 7.4 g/dL (ref 6.5–8.1)

## 2022-12-28 LAB — CBC WITH DIFFERENTIAL/PLATELET
Abs Immature Granulocytes: 0.02 10*3/uL (ref 0.00–0.07)
Basophils Absolute: 0 10*3/uL (ref 0.0–0.1)
Basophils Relative: 0 %
Eosinophils Absolute: 0.2 10*3/uL (ref 0.0–0.5)
Eosinophils Relative: 4 %
HCT: 38 % — ABNORMAL LOW (ref 39.0–52.0)
Hemoglobin: 11.8 g/dL — ABNORMAL LOW (ref 13.0–17.0)
Immature Granulocytes: 0 %
Lymphocytes Relative: 25 %
Lymphs Abs: 1.2 10*3/uL (ref 0.7–4.0)
MCH: 28.5 pg (ref 26.0–34.0)
MCHC: 31.1 g/dL (ref 30.0–36.0)
MCV: 91.8 fL (ref 80.0–100.0)
Monocytes Absolute: 0.5 10*3/uL (ref 0.1–1.0)
Monocytes Relative: 10 %
Neutro Abs: 2.9 10*3/uL (ref 1.7–7.7)
Neutrophils Relative %: 61 %
Platelets: 146 10*3/uL — ABNORMAL LOW (ref 150–400)
RBC: 4.14 MIL/uL — ABNORMAL LOW (ref 4.22–5.81)
RDW: 13.4 % (ref 11.5–15.5)
WBC: 4.7 10*3/uL (ref 4.0–10.5)
nRBC: 0 % (ref 0.0–0.2)

## 2022-12-28 LAB — LIPASE, BLOOD: Lipase: 42 U/L (ref 11–51)

## 2022-12-28 MED ORDER — FENTANYL CITRATE PF 50 MCG/ML IJ SOSY
50.0000 ug | PREFILLED_SYRINGE | Freq: Once | INTRAMUSCULAR | Status: AC
  Administered 2022-12-28: 50 ug via INTRAVENOUS
  Filled 2022-12-28: qty 1

## 2022-12-28 MED ORDER — IOHEXOL 300 MG/ML  SOLN
100.0000 mL | Freq: Once | INTRAMUSCULAR | Status: AC | PRN
  Administered 2022-12-28: 100 mL via INTRAVENOUS

## 2022-12-28 MED ORDER — FAMOTIDINE IN NACL 20-0.9 MG/50ML-% IV SOLN
20.0000 mg | Freq: Once | INTRAVENOUS | Status: AC
  Administered 2022-12-28: 20 mg via INTRAVENOUS
  Filled 2022-12-28: qty 50

## 2022-12-28 MED ORDER — ALUM & MAG HYDROXIDE-SIMETH 200-200-20 MG/5ML PO SUSP
30.0000 mL | Freq: Once | ORAL | Status: AC
  Administered 2022-12-28: 30 mL via ORAL
  Filled 2022-12-28: qty 30

## 2022-12-28 MED ORDER — ALBUTEROL SULFATE HFA 108 (90 BASE) MCG/ACT IN AERS: 2.0000 | INHALATION_SPRAY | RESPIRATORY_TRACT | Status: DC | PRN

## 2022-12-28 MED ORDER — LIDOCAINE VISCOUS HCL 2 % MT SOLN
15.0000 mL | Freq: Once | OROMUCOSAL | Status: AC
  Administered 2022-12-28: 15 mL via ORAL
  Filled 2022-12-28: qty 15

## 2022-12-28 NOTE — Telephone Encounter (Signed)
Final Outcome: Go to ED Now. Per chart, pt is currently at the ED.   Patient Name First: Bruce Last: Conner Gender: Male DOB: 1955/02/09 Age: 67 Y 1 M 1 D Return Phone Number: [phone] (Primary) Address: City/ State/ Zip: [place]  [address] Client [facility] - [facility], [facility] Provider Jacquiline Doe- MD Contact Type Call Who Is Calling Patient / Member / Family / Caregiver Call Type Triage / Clinical Relationship To Patient Self Return Phone Number [phone] (Primary) Chief Complaint SEVERE ABDOMINAL PAIN - Severe pain in abdomen Reason for Call Symptomatic / Request for Health Information Initial Comment Patient is experiencing sharp abdominal pain. Translation No Nurse Assessment Nurse: Suezanne Jacquet, RN, Riley Lam Date/Time (Eastern Time): 12/28/2022 11:42:03 AM Confirm and document reason for call. If symptomatic, describe symptoms. ---Mid upper abdomen pain. began last week. Intermittent. Rubbing helps. Eating doesn't make it worse. Worse with movement. Did a little heavy lifting. Not sure if related. Rates pain 3-4/10 when moving. Does the patient have any new or worsening symptoms? ---Yes Will a triage be completed? ---Yes Related visit to physician within the last 2 weeks? ---No Does the PT have any chronic conditions? (i.e. diabetes, asthma, this includes High risk factors for pregnancy, etc.) ---Yes List chronic conditions. ---Liver transplant, htn Is this a behavioral health or substance abuse call? ---No Guidelines Guideline Title Affirmed Question Affirmed Notes Nurse Date/Time (Eastern Time) Chest Pain [1] Chest pain (or "angina") comes and goes AND [2] is happening more often (increasing in frequency) or getting Camillia Herter 12/28/2022 11:45:24 AM Guidelines Guideline Title Affirmed Question Affirmed Notes Nurse Date/Time Lamount Cohen Time) worse (increasing  in severity) (Exception: Chest pains that last only a few seconds.) Disp. Time Lamount Cohen Time) Disposition Final User 12/28/2022 11:39:20 AM Send to Urgent Ruta Hinds 12/28/2022 11:48:13 AM Go to ED Now Yes Suezanne Jacquet, RN, Riley Lam Final Disposition 12/28/2022 11:48:13 AM Go to ED Now Yes Suezanne Jacquet, RN, York Spaniel Disagree/Comply Comply Caller Understands Yes PreDisposition Go to ED Care Advice Given Per Guideline GO TO ED NOW: * Leave now. Drive carefully. ANOTHER ADULT SHOULD DRIVE: NOTHING BY MOUTH: * Do not eat or drink anything for now. CARE ADVICE given per Chest Pain (Adult) guideline. * Severe difficulty breathing occurs * You become worse CALL 911 IF:  Comments User: Cameron Proud, RN Date/Time (Eastern Time): 12/28/2022 11:46:25 AM Pain calmed down and not hurting while sitting but when he moves it comes back. Referrals [facility] - ED

## 2022-12-28 NOTE — Telephone Encounter (Signed)
FYI: This call has been transferred to triage nurse: Access Nurse. Once the result note has been entered staff can address the message at that time.  Patient called in with the following symptoms:  Red Word: intermittent sharp abdominal pain x 1 week ; between top of stomach and bottom of chest, sometimes goes across lower back    Please advise at [facility] [phone]  Message is routed to Provider Pool.

## 2022-12-28 NOTE — Discharge Instructions (Addendum)
Your symptoms can be due to constipation, trial a course of MiraLAX, try 4 capfuls of MiraLAX in a 32 ounce Gatorade or equivalent size container and drink over 4 hours, we can repeat the following day for a satisfactory bowel movement.  Your CT imaging was overall reassuring as was your laboratory workup. Symptoms could have also been due to a small fat containing periumbilical hernia.  Follow-up with your primary care provider.  CT Results: FINDINGS:  Lower chest: There are patchy atelectatic changes in the visualized  lung bases. No overt consolidation. No pleural effusion. The heart  is normal in size. No pericardial effusion. 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    Hepatobiliary: The liver is normal in size. Non-cirrhotic  configuration. No suspicious mass. These is mild diffuse hepatic  steatosis. No intrahepatic bile duct dilation. There is mild  prominence of the extrahepatic bile duct, most likely due to post  cholecystectomy status. Gallbladder is surgically absent.    Pancreas: Unremarkable. No pancreatic ductal dilatation or  surrounding inflammatory changes.    Spleen: Within normal limits. No focal lesion.    Adrenals/Urinary Tract: Adrenal glands are unremarkable. 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 No suspicious renal mass. No hydronephrosis. No renal or  ureteric calculi. Unremarkable urinary bladder.    Stomach/Bowel: No disproportionate dilation of the small or large  bowel loops. No evidence of abnormal bowel wall thickening or  inflammatory changes. The appendix was not visualized; however there  is no acute inflammatory process in the right lower quadrant.    Vascular/Lymphatic: No ascites or pneumoperitoneum. No abdominal or  pelvic lymphadenopathy, by size criteria. No aneurysmal dilation of  the major abdominal arteries.    Reproductive: Normal  size prostate. Symmetric seminal vesicles.    Other: There is a tiny periumbilical hernia containing portion of  unobstructed small bowel loop and fat. There is also a tiny fat  containing right inguinal hernia. The soft tissues and abdominal  wall are otherwise unremarkable.    Musculoskeletal: No suspicious osseous lesions. There are mild  multilevel degenerative changes in the visualized spine.    IMPRESSION:  *No acute inflammatory process identified within the abdomen or  pelvis. No imaging signs of acute pancreatitis.  *Multiple other nonacute observations, as described above.

## 2022-12-28 NOTE — ED Provider Notes (Signed)
Patient was seen by Dr. Karene Fry please see his note.  Patient was pending a CBC because it had initially clotted.  Patient's CBC shows a white count of 4.7, hemoglobin 11.8, platelet 146.  Not significantly changed compared to previous values.  Patient CT scan previously did not show any acute abnormalities.  Stable for discharge   Linwood Dibbles, MD 12/28/22 1821

## 2022-12-28 NOTE — ED Triage Notes (Signed)
Patient presents due to shortness of breath and upper abdominal pain. Denies nausea and vomiting. He is unsure of the cause for the symptoms that started last week, but does report they have worsened since. Pain is temporarily relieved by rubbing on the abdomin.

## 2022-12-28 NOTE — ED Provider Notes (Addendum)
[place] EMERGENCY DEPARTMENT AT [facility] Provider Note   CSN: [id number] Arrival date & time: 12/28/22  1213     History  Chief Complaint  Patient presents with   Abdominal Pain   Shortness of Breath    Bruce Conner is a 67 y.o. male.   Abdominal Pain Associated symptoms: shortness of breath   Shortness of Breath Associated symptoms: abdominal pain      67 year old male with medical history significant for liver failure status post liver transplant in 2016, compliant with his CellCept and Prograf, CKD, GERD, HTN who presents the emergency department with abdominal pain.  The patient states that he has had epigastric abdominal pain since yesterday.  It sometimes radiates to his back and is intermittently sharp.  He denies any history of biliary disease.  He is moving his bowels although feels like his stools have been more hard recently.  He is passing gas.  Additionally reported  shortness of breath in the setting of his epigastric pain.  He denies any chest pain.  Home Medications Prior to Admission medications   Medication Sig Start Date End Date Taking? Authorizing Provider  amLODipine (NORVASC) 10 MG tablet TAKE 1 TABLET(10 MG) BY MOUTH DAILY 08/25/22   Ardith Dark, MD  aspirin EC 81 MG tablet Take 1 tablet (81 mg total) by mouth daily. 07/21/16   Veryl Speak, FNP  atorvastatin (LIPITOR) 40 MG tablet Take 1 tablet (40 mg total) by mouth daily. 10/21/22   Ardith Dark, MD  doxazosin (CARDURA) 8 MG tablet TAKE 1 TABLET(8 MG) BY MOUTH DAILY 12/08/22   Ardith Dark, MD  K Phos Mono-Sod Phos Di & Mono (VIRT-PHOS 250 NEUTRAL) [phone] MG TABS Take 2 tablets by mouth daily. 05/20/18   [provider]  losartan (COZAAR) 25 MG tablet Take 25 mg by mouth daily.    [provider]  magnesium oxide (MAG-OX) 400 MG tablet Take 400 mg by mouth daily.    [provider]  mycophenolate (CELLCEPT) 500 MG tablet  05/10/19   [provider]  mycophenolate (MYFORTIC) 360 MG TBEC EC tablet TAKE 2 TABLET BY MOUTH TWICE DAILY 10/18/17   Ardith Dark, MD  omeprazole (PRILOSEC) 20 MG capsule Take 20 mg by mouth daily.    [provider]  sulfamethoxazole-trimethoprim (BACTRIM) 400-80 MG tablet Take 1 tablet by mouth 3 (three) times a week.    [provider]  tacrolimus (PROGRAF) 1 MG capsule Take 4 capsules (4 mg total) by mouth 2 (two) times daily. Take 4 capsules in the morning and 4 capsules in the evening 10/18/17   Ardith Dark, MD  tadalafil (CIALIS) 10 MG tablet TAKE 1 TABLET(10 MG) BY MOUTH DAILY AS NEEDED FOR ERECTILE DYSFUNCTION 10/21/22   Ardith Dark, MD  triamcinolone ointment (KENALOG) 0.5 % Apply 1 Application topically 2 (two) times daily. 04/03/22   Ardith Dark, MD      Allergies    Patient has no known allergies.    Review of Systems   Review of Systems  Respiratory:  Positive for shortness of breath.   Gastrointestinal:  Positive for abdominal pain.  All other systems reviewed and are negative.   Physical Exam Updated Vital Signs BP 138/85   Pulse 63   Temp 97.8 F (36.6 C) (Oral)   Resp 16   SpO2 97%  Physical Exam Vitals and nursing note reviewed.  Constitutional:      General: He  is not in acute distress.    Appearance: He is well-developed.  HENT:     Head: Normocephalic and atraumatic.  Eyes:     Conjunctiva/sclera: Conjunctivae normal.  Cardiovascular:     Rate and Rhythm: Normal rate and regular rhythm.     Heart sounds: No murmur heard. Pulmonary:     Effort: Pulmonary effort is normal. No respiratory distress.     Breath sounds: Normal breath sounds.  Abdominal:     General: There is distension.     Palpations: Abdomen is soft.     Tenderness: There is abdominal tenderness in the epigastric area. There is no guarding or rebound.  Musculoskeletal:        General: No swelling.     Cervical back: Neck supple.  Skin:    General: Skin is warm  and dry.     Capillary Refill: Capillary refill takes less than 2 seconds.  Neurological:     Mental Status: He is alert.  Psychiatric:        Mood and Affect: Mood normal.     ED Results / Procedures / Treatments   Labs (all labs ordered are listed, but only abnormal results are displayed) Labs Reviewed  COMPREHENSIVE METABOLIC PANEL - Abnormal; Notable for the following components:      Result Value   Creatinine, Ser 1.32 (*)    GFR, Estimated 58 (*)    All other components within normal limits  CBC WITH DIFFERENTIAL/PLATELET - Abnormal; Notable for the following components:   RBC 4.14 (*)    Hemoglobin 11.8 (*)    HCT 38.0 (*)    Platelets 146 (*)    All other components within normal limits  LIPASE, BLOOD    EKG EKG Interpretation Date/Time:  Monday December 28 2022 12:42:53 EST Ventricular Rate:  79 PR Interval:  159 QRS Duration:  90 QT Interval:  379 QTC Calculation: 435 R Axis:   23  Text Interpretation: Sinus rhythm Confirmed by Ernie Avena ([id number]) on 12/28/2022 5:27:09 PM  Radiology CT ABDOMEN PELVIS W CONTRAST  Result Date: 12/28/2022 CLINICAL DATA:  Pancreatitis, acute, severe.  Upper abdominal pain. EXAM: CT ABDOMEN AND PELVIS WITH CONTRAST TECHNIQUE: Multidetector CT imaging of the abdomen and pelvis was performed using the standard protocol following bolus administration of intravenous contrast. RADIATION DOSE REDUCTION: This exam was performed according to the departmental dose-optimization program which includes automated exposure control, adjustment of the mA and/or kV according to patient size and/or use of iterative reconstruction technique. CONTRAST:  OMNIPAQUE IOHEXOL 300 MG/ML  SOLN COMPARISON:  CT scan abdomen and pelvis from 10/29/2014. FINDINGS: Lower chest: There are patchy atelectatic changes in the visualized lung bases. No overt consolidation. No pleural effusion. The heart is normal in size. No pericardial effusion. 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 Hepatobiliary: The liver is normal in size. Non-cirrhotic configuration. No suspicious mass. These is mild diffuse hepatic steatosis. No intrahepatic bile duct dilation. There is mild prominence of the extrahepatic bile duct, most likely due to post cholecystectomy status. Gallbladder is surgically absent. Pancreas: Unremarkable. No pancreatic ductal dilatation or surrounding inflammatory changes. Spleen: Within normal limits. No focal lesion. Adrenals/Urinary Tract: Adrenal glands are unremarkable. 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 No suspicious renal mass. No hydronephrosis. No renal or ureteric calculi. Unremarkable urinary bladder. Stomach/Bowel: No disproportionate dilation of the small or large bowel  loops. No evidence of abnormal bowel wall thickening or inflammatory changes. The appendix was not visualized; however there is no acute inflammatory process in the right lower quadrant. Vascular/Lymphatic: No ascites or pneumoperitoneum. No abdominal or pelvic lymphadenopathy, by size criteria. No aneurysmal dilation of the major abdominal arteries. Reproductive: Normal size prostate. Symmetric seminal vesicles. Other: There is a tiny periumbilical hernia containing portion of unobstructed small bowel loop and fat. There is also a tiny fat containing right inguinal hernia. The soft tissues and abdominal wall are otherwise unremarkable. Musculoskeletal: No suspicious osseous lesions. There are mild multilevel degenerative changes in the visualized spine. IMPRESSION: *No acute inflammatory process identified within the abdomen or pelvis. No imaging signs of acute pancreatitis. *Multiple other nonacute observations, as described above. Electronically Signed   By: Jules Schick M.D.   On: 12/28/2022 16:37   DG Chest 2 View  Result Date: 12/28/2022 CLINICAL DATA:  Shortness of  breath. EXAM: CHEST - 2 VIEW COMPARISON:  None Available. FINDINGS: Bilateral lung fields are clear. Bilateral costophrenic angles are clear. Normal cardio-mediastinal silhouette. No acute osseous abnormalities. The soft tissues are within normal limits. IMPRESSION: No active cardiopulmonary disease. Electronically Signed   By: Jules Schick M.D.   On: 12/28/2022 15:33    Procedures Procedures    Medications Ordered in ED Medications  fentaNYL (SUBLIMAZE) injection 50 mcg (50 mcg Intravenous Given 12/28/22 1459)  iohexol (OMNIPAQUE) 300 MG/ML solution 100 mL (100 mLs Intravenous Contrast Given 12/28/22 1512)  famotidine (PEPCID) IVPB 20 mg premix (20 mg Intravenous New Bag/Given 12/28/22 1800)  alum & mag hydroxide-simeth (MAALOX/MYLANTA) 200-200-20 MG/5ML suspension 30 mL (30 mLs Oral Given 12/28/22 1800)    And  lidocaine (XYLOCAINE) 2 % viscous mouth solution 15 mL (15 mLs Oral Given 12/28/22 1800)    ED Course/ Medical Decision Making/ A&P                                 Medical Decision Making Amount and/or Complexity of Data Reviewed Labs: ordered. Radiology: ordered.  Risk OTC drugs. Prescription drug management.     67 year old male with medical history significant for liver failure status post liver transplant in 2016, compliant with his CellCept and Prograf, CKD, GERD, HTN who presents the emergency department with abdominal pain.  The patient states that he has had epigastric abdominal pain since yesterday.  It sometimes radiates to his back and is intermittently sharp.  He denies any history of biliary disease.  He is moving his bowels although feels like his stools have been more hard recently.  He is passing gas.  Additionally reported  shortness of breath in the setting of his epigastric pain.  He denies any chest pain.  On arrival, the patient was vitally stable, afebrile, not tachycardic, sinus rhythm noted on cardiac telemetry.  Physical exam with epigastric tenderness  to palpation, mildly distended abdomen noted.  Concern for constipation, pancreatitis, less likely bowel obstruction or other acute intra-abdominal abnormality.  Initial EKG was performed revealed sinus rhythm, ventricular rate 79, no evidence of acute ischemic changes.  A chest x-ray revealed no active cardiopulmonary disease.  He has no chest pain, low concern for ACS or other acute intrathoracic abnormality.  Screening laboratory evaluation was obtained to include CMP which was generally unremarkable, lipase which was normal.  CBC was clotted and had to be recollected.  The patient was administered IV fentanyl with improvement in pain control.  CT  abdomen pelvis: FINDINGS:  Lower chest: There are patchy atelectatic changes in the visualized  lung bases. No overt consolidation. No pleural effusion. The heart  is normal in size. No pericardial effusion. 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    Hepatobiliary: The liver is normal in size. Non-cirrhotic  configuration. No suspicious mass. These is mild diffuse hepatic  steatosis. No intrahepatic bile duct dilation. There is mild  prominence of the extrahepatic bile duct, most likely due to post  cholecystectomy status. Gallbladder is surgically absent.    Pancreas: Unremarkable. No pancreatic ductal dilatation or  surrounding inflammatory changes.    Spleen: Within normal limits. No focal lesion.    Adrenals/Urinary Tract: Adrenal glands are unremarkable. 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 No suspicious renal mass. No hydronephrosis. No renal or  ureteric calculi. Unremarkable urinary bladder.    Stomach/Bowel: No disproportionate dilation of the small or large  bowel loops. No evidence of abnormal bowel wall thickening or  inflammatory changes. The appendix was not visualized; however there  is no acute inflammatory process  in the right lower quadrant.    Vascular/Lymphatic: No ascites or pneumoperitoneum. No abdominal or  pelvic lymphadenopathy, by size criteria. No aneurysmal dilation of  the major abdominal arteries.    Reproductive: Normal size prostate. Symmetric seminal vesicles.    Other: There is a tiny periumbilical hernia containing portion of  unobstructed small bowel loop and fat. There is also a tiny fat  containing right inguinal hernia. The soft tissues and abdominal  wall are otherwise unremarkable.    Musculoskeletal: No suspicious osseous lesions. There are mild  multilevel degenerative changes in the visualized spine.    IMPRESSION:  *No acute inflammatory process identified within the abdomen or  pelvis. No imaging signs of acute pancreatitis.  *Multiple other nonacute observations, as described above.   Patient feeling symptomatically improved with resolved pain on repeat assessment status post fentanyl.  He has no acute findings identified on CT abdomen pelvis although a small periumbilical hernia containing portion of unobstructed small bowel loop and fat was also noted however this is not in the area where the patient was having pain.  Suspect potential constipation, I recommended a trial of MiraLAX.  Additionally considered gastritis and/or GERD.  Patient was feeling symptomatically improved, tolerated p.o. challenge of Maalox and lidocaine.  Plan to trial a bowel regimen at home and follow-up with his PCP.  Plan at time of signout to follow-up results of patient CBC as CBC had clotted and was needing recollection, plan for likely discharge home with outpatient PCP follow-up, return precautions provided.   Final Clinical Impression(s) / ED Diagnoses Final diagnoses:  Epigastric pain  Constipation, unspecified constipation type    Rx / DC Orders ED Discharge Orders     None         Ernie Avena, MD 12/28/22 Margretta Ditty    Ernie Avena, MD 12/28/22 [phone]

## 2022-12-28 NOTE — Telephone Encounter (Signed)
See note

## 2023-01-07 ENCOUNTER — Encounter: Payer: Self-pay | Admitting: Family Medicine

## 2023-01-07 ENCOUNTER — Ambulatory Visit: Payer: Medicare HMO | Admitting: Family Medicine

## 2023-01-07 VITALS — BP 136/76 | HR 69 | Temp 97.7°F | Ht 65.0 in | Wt 201.2 lb

## 2023-01-07 DIAGNOSIS — Z944 Liver transplant status: Secondary | ICD-10-CM | POA: Diagnosis not present

## 2023-01-07 DIAGNOSIS — I10 Essential (primary) hypertension: Secondary | ICD-10-CM | POA: Diagnosis not present

## 2023-01-07 DIAGNOSIS — E785 Hyperlipidemia, unspecified: Secondary | ICD-10-CM

## 2023-01-07 NOTE — Patient Instructions (Signed)
It was very nice to see you today!  I am glad that you are feeling better.  Please let us know if your symptoms come back.  Please keep an eye on your blood pressure and let us know if it is persistently elevated.  Return if symptoms worsen or fail to improve.   Take care, Dr Jimmey Ralph  PLEASE NOTE:  If you had any lab tests, please let us know if you have not heard back within a few days. You may see your results on mychart before we have a chance to review them but we will give you a call once they are reviewed by [place].   If we ordered any referrals today, please let us know if you have not heard from their office within the next week.   If you had any urgent prescriptions sent in today, please check with the pharmacy within an hour of our visit to make sure the prescription was transmitted appropriately.   Please try these tips to maintain a healthy lifestyle:  Eat at least 3 REAL meals and 1-2 snacks per day.  Aim for no more than 5 hours between eating.  If you eat breakfast, please do so within one hour of getting up.   Each meal should contain half fruits/vegetables, one quarter protein, and one quarter carbs (no bigger than a computer mouse)  Cut down on sweet beverages. This includes juice, soda, and sweet tea.   Drink at least 1 glass of water with each meal and aim for at least 8 glasses per day  Exercise at least 150 minutes every week.  I

## 2023-01-07 NOTE — Progress Notes (Signed)
   Bruce Conner is a 67 y.o. male who presents today for an office visit.  Assessment/Plan:  New/Acute Problems: Epigastric Abdominal Pain Resolved.  Reassuring exam today.  May have had mild gastritis.  Workup in ED was negative.  Given resolution of symptoms do not need to pursue any further workup at this point however he will let us know if he has any recurrence.  At that time would consider gastroenterology referral for further evaluation.   Chronic Problems Addressed Today: Essential hypertension Initially elevated however at goal on recheck.  He has been at goal at home as well.  Continue losartan 25 mg daily, amlodipine 10 mg daily, and Cardura 8 mg daily.  Status post liver transplant (HCC) Do not think that this is contributing to his above pain.  He did have workup in the ED which was negative including LFTs and imaging.  Dyslipidemia Last LDL 73 on Lipitor 40 mg daily.  Tolerating well - no signs of hepatitis on recent work up.  Recheck lipids at next physical.     Subjective:  HPI:  See A/P for status of chronic conditions.  Patient is here today for ED follow-up.  Went to the ED 10 days ago with epigastric abdominal pain.  Had workup there including labs and CT scan but his workup was negative.  There was concern for constipation and he was discharged home with trial of MiraLAX.  He did start taking the miralax which has helped. He is still having some tenderness but this is improving. No nausea or vomtiing. Normal appetite. He is now not having any issues with constipation or diarrhea. He did have a lot of gas but this has improved.        Objective:  Physical Exam: BP 136/76   Pulse 69   Temp 97.7 F (36.5 C) (Temporal)   Ht 5\' 5"  (1.651 m)   Wt 201 lb 3.2 oz (91.3 kg)   SpO2 99%   BMI 33.48 kg/m   Gen: No acute distress, resting comfortably CV: Regular rate and rhythm with no murmurs appreciated Pulm: Normal work of breathing, clear to auscultation  bilaterally with no crackles, wheezes, or rhonchi Abdomen: Bowel sounds present, soft, nontender, nondistended Neuro: Grossly normal, moves all extremities Psych: Normal affect and thought content      Bruce Conner M. Jimmey Ralph, MD 01/07/2023 8:09 AM

## 2023-01-07 NOTE — Assessment & Plan Note (Signed)
Initially elevated however at goal on recheck.  He has been at goal at home as well.  Continue losartan 25 mg daily, amlodipine 10 mg daily, and Cardura 8 mg daily.

## 2023-01-07 NOTE — Assessment & Plan Note (Signed)
Do not think that this is contributing to his above pain.  He did have workup in the ED which was negative including LFTs and imaging.

## 2023-01-07 NOTE — Assessment & Plan Note (Signed)
Last LDL 73 on Lipitor 40 mg daily.  Tolerating well - no signs of hepatitis on recent work up.  Recheck lipids at next physical.

## 2023-02-25 ENCOUNTER — Other Ambulatory Visit: Payer: Self-pay | Admitting: Family Medicine

## 2023-03-02 DIAGNOSIS — I129 Hypertensive chronic kidney disease with stage 1 through stage 4 chronic kidney disease, or unspecified chronic kidney disease: Secondary | ICD-10-CM | POA: Diagnosis not present

## 2023-03-02 DIAGNOSIS — N183 Chronic kidney disease, stage 3 unspecified: Secondary | ICD-10-CM | POA: Diagnosis not present

## 2023-03-02 DIAGNOSIS — E669 Obesity, unspecified: Secondary | ICD-10-CM | POA: Diagnosis not present

## 2023-03-02 DIAGNOSIS — Z944 Liver transplant status: Secondary | ICD-10-CM | POA: Diagnosis not present

## 2023-04-08 ENCOUNTER — Telehealth: Payer: Self-pay | Admitting: *Deleted

## 2023-04-08 ENCOUNTER — Other Ambulatory Visit: Payer: Self-pay | Admitting: *Deleted

## 2023-04-08 ENCOUNTER — Other Ambulatory Visit: Payer: Self-pay | Admitting: Family Medicine

## 2023-04-08 NOTE — Telephone Encounter (Signed)
 Copied from CRM [id number]. Topic: Clinical - Medication Question >> Apr 08, 2023  1:04 PM [place] E wrote: Reason for CRM: Patient got a call from pharmacy stating that he does not have refills left for his doxazosin (CARDURA) 8 MG tablet medication. Patient questioning if PCP can sig-off for more refills for this medication. Callback number for patient is [phone] to confirm.

## 2023-04-08 NOTE — Telephone Encounter (Signed)
 Rx send to pharmacy

## 2023-06-14 ENCOUNTER — Ambulatory Visit: Payer: Medicare HMO

## 2023-06-21 ENCOUNTER — Ambulatory Visit (INDEPENDENT_AMBULATORY_CARE_PROVIDER_SITE_OTHER)

## 2023-06-21 VITALS — BP 136/76 | Ht 65.0 in | Wt 195.0 lb

## 2023-06-21 DIAGNOSIS — Z2821 Immunization not carried out because of patient refusal: Secondary | ICD-10-CM | POA: Diagnosis not present

## 2023-06-21 DIAGNOSIS — Z0000 Encounter for general adult medical examination without abnormal findings: Secondary | ICD-10-CM | POA: Diagnosis not present

## 2023-06-21 NOTE — Progress Notes (Signed)
 Because this visit was a virtual/telehealth visit,  certain criteria was not obtained, such a blood pressure, CBG if applicable, and timed get up and go. Any medications not marked as "taking" were not mentioned during the medication reconciliation part of the visit. Any vitals not documented were not able to be obtained due to this being a telehealth visit or patient was unable to self-report a recent blood pressure reading due to a lack of equipment at home via telehealth. Vitals that have been documented are verbally provided by the patient.   This visit was performed by a medical professional under my direct supervision. I was immediately available for consultation/collaboration. I have reviewed and agree with the Annual Wellness Visit documentation.  Subjective:   Bruce Conner is a 68 y.o. who presents for a Medicare Wellness preventive visit.  As a reminder, Annual Wellness Visits don't include a physical exam, and some assessments may be limited, especially if this visit is performed virtually. We may recommend an in-person follow-up visit with your provider if needed.  Visit Complete: Virtual I connected with  [employer] on 06/21/23 by a audio enabled telemedicine application and verified that I am speaking with the correct person using two identifiers.  Patient Location: Home  Provider Location: Home Office  I discussed the limitations of evaluation and management by telemedicine. The patient expressed understanding and agreed to proceed.  Vital Signs: Because this visit was a virtual/telehealth visit, some criteria may be missing or patient reported. Any vitals not documented were not able to be obtained and vitals that have been documented are patient reported.  VideoDeclined- This patient declined Librarian, academic. Therefore the visit was completed with audio only.  Persons Participating in Visit: Patient.  AWV Questionnaire: No: Patient  Medicare AWV questionnaire was not completed prior to this visit.  Cardiac Risk Factors include: advanced age (>74men, >65 women);male gender;obesity (BMI >30kg/m2);dyslipidemia;hypertension     Objective:     Today's Vitals   06/21/23 1318  BP: 136/76  Weight: 195 lb (88.5 kg)  Height: 5\' 5"  (1.651 m)   Body mass index is 32.45 kg/m.     06/21/2023    1:23 PM 06/08/2022    8:56 AM 05/26/2021    9:00 AM 10/20/2017   12:46 AM 10/28/2014    5:42 PM  Advanced Directives  Does Patient Have a Medical Advance Directive? No No No No No  Would patient like information on creating a medical advance directive? No - Patient declined No - Patient declined No - Patient declined No - Patient declined No - patient declined information    Current Medications (verified) Outpatient Encounter Medications as of 06/21/2023  Medication Sig   amLODipine  (NORVASC ) 10 MG tablet TAKE 1 TABLET(10 MG) BY MOUTH DAILY   aspirin  EC 81 MG tablet Take 1 tablet (81 mg total) by mouth daily.   atorvastatin  (LIPITOR) 40 MG tablet Take 1 tablet (40 mg total) by mouth daily.   doxazosin  (CARDURA ) 8 MG tablet TAKE 1 TABLET(8 MG) BY MOUTH DAILY   K Phos Mono-Sod Phos Di & Mono (VIRT-PHOS 250 NEUTRAL) 155-852-130 MG TABS Take 2 tablets by mouth daily.   losartan (COZAAR) 25 MG tablet Take 25 mg by mouth daily.   magnesium  oxide (MAG-OX) 400 MG tablet Take 400 mg by mouth daily.   mycophenolate  (MYFORTIC ) 360 MG TBEC EC tablet TAKE 2 TABLET BY MOUTH TWICE DAILY   omeprazole (PRILOSEC) 20 MG capsule Take 20 mg by mouth daily.  sulfamethoxazole-trimethoprim (BACTRIM) 400-80 MG tablet Take 1 tablet by mouth 3 (three) times a week.   tacrolimus  (PROGRAF ) 1 MG capsule Take 4 capsules (4 mg total) by mouth 2 (two) times daily. Take 4 capsules in the morning and 4 capsules in the evening   tadalafil  (CIALIS ) 10 MG tablet TAKE 1 TABLET(10 MG) BY MOUTH DAILY AS NEEDED FOR ERECTILE DYSFUNCTION   triamcinolone  ointment (KENALOG ) 0.5  % Apply 1 Application topically 2 (two) times daily.   mycophenolate  (CELLCEPT) 500 MG tablet  (Patient not taking: Reported on 06/21/2023)   No facility-administered encounter medications on file as of 06/21/2023.    Allergies (verified) Patient has no known allergies.   History: Past Medical History:  Diagnosis Date   Acute renal injury (HCC)    Blood transfusion without reported diagnosis    Chronic kidney disease, stage III (moderate) (HCC)    Cirrhosis of liver (HCC)    GERD (gastroesophageal reflux disease)    Hypertension    Hypomagnesemia    Hypophosphatemia    Liver failure (HCC)    Pyorrhea    S/P liver transplant (HCC) 11/12/2014   @ [facility], Cellcept and Prograf    Past Surgical History:  Procedure Laterality Date   ABDOMINAL SURGERY     COLONOSCOPY     ?2009   LIVER TRANSPLANT  11/12/2014   [facility]   Family History  Problem Relation Age of Onset   Hypertension Brother    Colon cancer Neg Hx    Colon polyps Neg Hx    Esophageal cancer Neg Hx    Rectal cancer Neg Hx    Stomach cancer Neg Hx    Social History   Socioeconomic History   Marital status: Married    Spouse name: Not on file   Number of children: 3   Years of education: 12   Highest education level: Not on file  Occupational History   Occupation: Warehouse  Tobacco Use   Smoking status: Never   Smokeless tobacco: Never  Vaping Use   Vaping status: Never Used  Substance and Sexual Activity   Alcohol use: No   Drug use: No   Sexual activity: Not on file  Other Topics Concern   Not on file  Social History Narrative   Fun: Photographer    Social Drivers of Health   Financial Resource Strain: Low Risk  (06/21/2023)   Overall Financial Resource Strain (CARDIA)    Difficulty of Paying Living Expenses: Not hard at all  Food Insecurity: No Food Insecurity (06/21/2023)   Hunger Vital Sign    Worried About Running Out of Food in the Last Year: Never true    Ran Out of Food in the Last Year: Never true   Transportation Needs: No Transportation Needs (06/21/2023)   PRAPARE - Administrator, Civil Service (Medical): No    Lack of Transportation (Non-Medical): No  Physical Activity: Insufficiently Active (06/21/2023)   Exercise Vital Sign    Days of Exercise per Week: 2 days    Minutes of Exercise per Session: 20 min  Stress: No Stress Concern Present (06/21/2023)   [employer] of Occupational Health - Occupational Stress Questionnaire    Feeling of Stress : Not at all  Social Connections: Socially Isolated (06/21/2023)   Social Connection and Isolation Panel [NHANES]    Frequency of Communication with Friends and Family: Twice a week    Frequency of Social Gatherings with Friends and Family: More than three times a week  Attends Religious Services: Never    Active Member of Clubs or Organizations: No    Attends Banker Meetings: Never    Marital Status: Separated    Tobacco Counseling Counseling given: Not Answered    Clinical Intake:  Pre-visit preparation completed: Yes  Pain : No/denies pain     BMI - recorded: 32.45 Nutritional Status: BMI > 30  Obese Nutritional Risks: None Diabetes: No  Lab Results  Component Value Date   HGBA1C 5.2 10/21/2022   HGBA1C 5.4 01/27/2021   HGBA1C 5.2 06/22/2017     How often do you need to have someone help you when you read instructions, pamphlets, or other written materials from your doctor or pharmacy?: 1 - Never What is the last grade level you completed in school?: 12th grade  Interpreter Needed?: No  Information entered by :: [employer]   Activities of Daily Living     06/21/2023    1:22 PM  In your present state of health, do you have any difficulty performing the following activities:  Hearing? 0  Vision? 0  Difficulty concentrating or making decisions? 0  Walking or climbing stairs? 0  Dressing or bathing? 0  Doing errands, shopping? 0  Preparing Food and eating ? N  Using  the Toilet? N  In the past six months, have you accidently leaked urine? N  Do you have problems with loss of bowel control? N  Managing your Medications? N  Managing your Finances? N  Housekeeping or managing your Housekeeping? N    Patient Care Team: Rodney Clamp, MD as PCP - General (Family Medicine) Baron Border, MD as Consulting Physician (Nephrology) Delemos, Perfecto Bracket, MD as Consulting Physician (Gastroenterology)  I have updated your Care Teams any recent Medical Services you may have received from other providers in the past year.     Assessment:    This is a routine wellness examination for Deangleo.  Hearing/Vision screen Hearing Screening - Comments:: No hearing aids  Vision Screening - Comments:: Patient has no hearing issues    Goals Addressed             This Visit's Progress    Patient Stated   On track    Exercise more        Depression Screen     06/21/2023    1:24 PM 01/07/2023    7:44 AM 10/21/2022    8:23 AM 06/08/2022    8:55 AM 08/12/2021    2:42 PM 05/26/2021    8:59 AM 01/27/2021    2:23 PM  PHQ 2/9 Scores  PHQ - 2 Score 0 0 0 0 0 0 0  PHQ- 9 Score 0          Fall Risk     06/21/2023    1:23 PM 01/07/2023    7:44 AM 10/21/2022    8:23 AM 06/08/2022    8:56 AM 04/03/2022    1:38 PM  Fall Risk   Falls in the past year? 0 0 0 0 0  Number falls in past yr: 0 0 0 0 0  Injury with Fall? 0 0 0 0 0  Risk for fall due to : No Fall Risks No Fall Risks No Fall Risks No Fall Risks No Fall Risks  Follow up Falls evaluation completed   Falls prevention discussed Falls evaluation completed    MEDICARE RISK AT HOME:  Medicare Risk at Home Any stairs in or around the home?: Yes If  so, are there any without handrails?: No Home free of loose throw rugs in walkways, pet beds, electrical cords, etc?: Yes Adequate lighting in your home to reduce risk of falls?: Yes Life alert?: No Use of a cane, walker or w/c?: No Grab bars in the bathroom?:  Yes Shower chair or bench in shower?: Yes Elevated toilet seat or a handicapped toilet?: Yes  TIMED UP AND GO:  Was the test performed?  No  Cognitive Function: 6CIT completed        06/21/2023    1:20 PM 06/08/2022    8:57 AM 05/26/2021    9:02 AM  6CIT Screen  What Year? 0 points 0 points 0 points  What month? 0 points 0 points 0 points  What time? 0 points 0 points 0 points  Count back from 20 0 points 0 points 0 points  Months in reverse 0 points 0 points 0 points  Repeat phrase 0 points 0 points 6 points  Total Score 0 points 0 points 6 points    Immunizations Immunization History  Administered Date(s) Administered   Fluad Quad(high Dose 65+) 12/06/2018, 11/07/2019, 01/26/2021   Fluad Trivalent(High Dose 65+) 10/21/2022   Influenza,inj,Quad PF,6+ Mos 11/17/2016   PFIZER Comirnaty(Gray Top)Covid-19 Tri-Sucrose Vaccine 08/16/2020   PFIZER(Purple Top)SARS-COV-2 Vaccination 06/06/2019, 06/26/2019   PNEUMOCOCCAL CONJUGATE-20 03/25/2021   Pneumococcal Conjugate-13 12/06/2018   Zoster Recombinant(Shingrix) 03/25/2021, 10/29/2022    Screening Tests Health Maintenance  Topic Date Due   COVID-19 Vaccine (4 - 2024-25 season) 09/20/2022   INFLUENZA VACCINE  08/20/2023   Medicare Annual Wellness (AWV)  06/20/2024   Colonoscopy  07/11/2026   Pneumonia Vaccine 18+ Years old  Completed   Hepatitis C Screening  Completed   Zoster Vaccines- Shingrix  Completed   HPV VACCINES  Aged Out   Meningococcal B Vaccine  Aged Out   DTaP/Tdap/Td  Discontinued    Health Maintenance  Health Maintenance Due  Topic Date Due   COVID-19 Vaccine (4 - 2024-25 season) 09/20/2022   Health Maintenance Items Addressed:patient declined covid vaccination  Additional Screening:  Vision Screening: Recommended annual ophthalmology exams for early detection of glaucoma and other disorders of the eye. Would you like a referral to an eye doctor? No    Dental Screening: Recommended annual dental  exams for proper oral hygiene  Community Resource Referral / Chronic Care Management: CRR required this visit?  No   CCM required this visit?  No   Plan:    I have personally reviewed and noted the following in the patient's chart:   Medical and social history Use of alcohol, tobacco or illicit drugs  Current medications and supplements including opioid prescriptions. Patient is not currently taking opioid prescriptions. Functional ability and status Nutritional status Physical activity Advanced directives List of other physicians Hospitalizations, surgeries, and ER visits in previous 12 months Vitals Screenings to include cognitive, depression, and falls Referrals and appointments  In addition, I have reviewed and discussed with patient certain preventive protocols, quality metrics, and best practice recommendations. A written personalized care plan for preventive services as well as general preventive health recommendations were provided to patient.   Freeda Jerry, [place]   06/21/2023   After Visit Summary: (MyChart) Due to this being a telephonic visit, the after visit summary with patients personalized plan was offered to patient via MyChart   Notes: Nothing significant to report at this time.

## 2023-06-21 NOTE — Patient Instructions (Signed)
 Bruce Conner , Thank you for taking time out of your busy schedule to complete your Annual Wellness Visit with me. I enjoyed our conversation and look forward to speaking with you again next year. I, as well as your care team,  appreciate your ongoing commitment to your health goals. Please review the following plan we discussed and let me know if I can assist you in the future. Your Game plan/ To Do List    Referrals: If you haven't heard from the office you've been referred to, please reach out to them at the phone provided.   Follow up Visits: Next Medicare AWV with our clinical staff: 06/21/2024   Have you seen your provider in the last 6 months (3 months if uncontrolled diabetes)? No Next Office Visit with your provider: 10/25/2023  Clinician Recommendations:  Aim for 30 minutes of exercise or brisk walking, 6-8 glasses of water, and 5 servings of fruits and vegetables each day.       This is a list of the screening recommended for you and due dates:  Health Maintenance  Topic Date Due   COVID-19 Vaccine (4 - 2024-25 season) 09/20/2022   Flu Shot  08/20/2023   Medicare Annual Wellness Visit  06/20/2024   Colon Cancer Screening  07/11/2026   Pneumonia Vaccine  Completed   Hepatitis C Screening  Completed   Zoster (Shingles) Vaccine  Completed   HPV Vaccine  Aged Out   Meningitis B Vaccine  Aged Out   DTaP/Tdap/Td vaccine  Discontinued    Advanced directives: (Declined) Advance directive discussed with you today. Even though you declined this today, please call our office should you change your mind, and we can give you the proper paperwork for you to fill out. Advance Care Planning is important because it:  [x]  Makes sure you receive the medical care that is consistent with your values, goals, and preferences  [x]  It provides guidance to your family and loved ones and reduces their decisional burden about whether or not they are making the right decisions based on your  wishes.  Follow the link provided in your after visit summary or read over the paperwork we have mailed to you to help you started getting your Advance Directives in place. If you need assistance in completing these, please reach out to us  so that we can help you!  See attachments for Preventive Care and Fall Prevention Tips.

## 2023-07-20 ENCOUNTER — Telehealth: Payer: Self-pay | Admitting: Family Medicine

## 2023-07-20 MED ORDER — DOXAZOSIN MESYLATE 8 MG PO TABS: 8.0000 mg | ORAL_TABLET | Freq: Every day | ORAL | 0 refills | Status: DC

## 2023-07-20 NOTE — Telephone Encounter (Signed)
 Copied from CRM [id number]. Topic: Clinical - Medication Refill >> Jul 20, 2023 11:15 AM Mesmerise C wrote: Medication:  doxazosin  (CARDURA ) 8 MG tablet   Has the patient contacted their pharmacy? Yes (Agent: If no, request that the patient contact the pharmacy for the refill. If patient does not wish to contact the pharmacy document the reason why and proceed with request.) (Agent: If yes, when and what did the pharmacy advise?) Stated they needed to speak to doctor to renew This is the patient's preferred pharmacy:   [facility] - [place], [place] - [address] [place] [address] Phone: [phone] Fax: [phone] Hours: Not open 24 hours    Is this the correct pharmacy for this prescription? Yes If no, delete pharmacy and type the correct one.   Has the prescription been filled recently? No  Is the patient out of the medication? Yes  Has the patient been seen for an appointment in the last year OR does the patient have an upcoming appointment? Yes  Can we respond through MyChart? Yes  Agent: Please be advised that Rx refills may take up to 3 business days. We ask that you follow-up with your pharmacy.

## 2023-08-26 ENCOUNTER — Other Ambulatory Visit: Payer: Self-pay | Admitting: *Deleted

## 2023-08-26 MED ORDER — ATORVASTATIN CALCIUM 40 MG PO TABS: 40.0000 mg | ORAL_TABLET | Freq: Every day | ORAL | 3 refills | Status: AC

## 2023-09-10 ENCOUNTER — Ambulatory Visit (INDEPENDENT_AMBULATORY_CARE_PROVIDER_SITE_OTHER): Admitting: Family Medicine

## 2023-09-10 ENCOUNTER — Encounter: Payer: Self-pay | Admitting: Family Medicine

## 2023-09-10 ENCOUNTER — Other Ambulatory Visit: Payer: Self-pay | Admitting: *Deleted

## 2023-09-10 VITALS — BP 135/77 | HR 71 | Temp 98.2°F | Ht 65.0 in | Wt 201.0 lb

## 2023-09-10 DIAGNOSIS — I10 Essential (primary) hypertension: Secondary | ICD-10-CM

## 2023-09-10 DIAGNOSIS — M25511 Pain in right shoulder: Secondary | ICD-10-CM | POA: Diagnosis not present

## 2023-09-10 DIAGNOSIS — N183 Chronic kidney disease, stage 3 unspecified: Secondary | ICD-10-CM

## 2023-09-10 MED ORDER — METHYLPREDNISOLONE ACETATE 80 MG/ML IJ SUSP
80.0000 mg | Freq: Once | INTRAMUSCULAR | Status: AC
  Administered 2023-09-10: 80 mg via INTRAMUSCULAR

## 2023-09-10 MED ORDER — AMLODIPINE BESYLATE 10 MG PO TABS: 10.0000 mg | ORAL_TABLET | Freq: Every day | ORAL | 1 refills | Status: AC

## 2023-09-10 MED ORDER — PREDNISONE 50 MG PO TABS: ORAL_TABLET | ORAL | 0 refills | Status: DC

## 2023-09-10 NOTE — Assessment & Plan Note (Signed)
 At goal today on current regimen losartan 25 mg daily, amlodipine  10 mg daily, and Cardura  8 mg daily.

## 2023-09-10 NOTE — Patient Instructions (Signed)
 It was very nice to see you today!  VISIT SUMMARY: Today, you were seen for right arm pain that started after moving a small dresser. You received a cortisone injection and a prescription for prednisone  to help with the pain.  YOUR PLAN: RIGHT UPPER ARM MUSCLE STRAIN: You have a muscle strain in your right upper arm, possibly involving the rotator cuff or biceps tendon. -You received a cortisone steroid injection today to help with the pain. -Start taking prednisone  tomorrow as prescribed to sustain improvement. -Please report back if your symptoms do not improve.  Return if symptoms worsen or fail to improve.   Take care, Dr Kennyth  PLEASE NOTE:  If you had any lab tests, please let us  know if you have not heard back within a few days. You may see your results on mychart before we have a chance to review them but we will give you a call once they are reviewed by us .   If we ordered any referrals today, please let us  know if you have not heard from their office within the next week.   If you had any urgent prescriptions sent in today, please check with the pharmacy within an hour of our visit to make sure the prescription was transmitted appropriately.   Please try these tips to maintain a healthy lifestyle:  Eat at least 3 REAL meals and 1-2 snacks per day.  Aim for no more than 5 hours between eating.  If you eat breakfast, please do so within one hour of getting up.   Each meal should contain half fruits/vegetables, one quarter protein, and one quarter carbs (no bigger than a computer mouse)  Cut down on sweet beverages. This includes juice, soda, and sweet tea.   Drink at least 1 glass of water with each meal and aim for at least 8 glasses per day  Exercise at least 150 minutes every week.

## 2023-09-10 NOTE — Assessment & Plan Note (Signed)
 Follows with nephrology.  Needs to avoid NSAIDs.

## 2023-09-10 NOTE — Progress Notes (Signed)
   Bruce Conner is a 68 y.o. male who presents today for an office visit.  Assessment/Plan:  New/Acute Problems: Right shoulder pain Overall reassuring exam without concern for rotator cuff tendinopathy though may have some component of biceps tendinitis as well.  He is not able to take NSAIDs due to CKD.  Cannot take Tylenol  due to history of liver transplant.  We will give 80 mg of Depo-Medrol  today and start prednisone  burst.  He has tolerated this well previously.  He will continue working on home exercises.  If no improvement with this would consider referral to PT or sports medicine.  Chronic Problems Addressed Today: Essential hypertension At goal today on current regimen losartan 25 mg daily, amlodipine  10 mg daily, and Cardura  8 mg daily.  CKD (chronic kidney disease) stage 3, GFR 30-59 ml/min (HCC) Follows with nephrology.  Needs to avoid NSAIDs.     Subjective:  HPI:  See assessment / plan for status of chronic conditions.   Discussed the use of AI scribe software for clinical note transcription with the patient, who gave verbal consent to proceed.  History of Present Illness Bruce Conner is a 68 year old male who presents with right arm pain.  He pulled a muscle in his right arm while moving a small dresser using a hand truck approximately one week ago. The pain is persistent, with periods of relief followed by recurrence. He has not taken any ibuprofen or acetaminophen  due to concerns related to liver and kidney issues. In the past, he was treated with prednisone  for a similar issue, which was effective in resolving the symptoms.  No reported numbness or tingling.  Had similar injury over a year ago that was treated with prednisone .         Objective:  Physical Exam: BP 135/77   Pulse 71   Temp 98.2 F (36.8 C) (Temporal)   Ht 5' 5 (1.651 m)   Wt 201 lb (91.2 kg)   SpO2 98%   BMI 33.45 kg/m   Gen: No acute distress, resting comfortably CV: Regular  rate and rhythm with no murmurs appreciated Pulm: Normal work of breathing, clear to auscultation bilaterally with no crackles, wheezes, or rhonchi MUSCULOSKELETAL: - Right Arm: No deformities.  Pain elicited with resisted supraspinatus testing.  Pain with external and internal rotation.  Positive Neer and Hawkins test.  Good range of motion.  Neurovascular intact distally.  Strength intact throughout. Neuro: Grossly normal, moves all extremities Psych: Normal affect and thought content      Tyiesha Brackney M. Kennyth, MD 09/10/2023 11:41 AM

## 2023-10-04 ENCOUNTER — Other Ambulatory Visit: Payer: Self-pay | Admitting: Family Medicine

## 2023-10-25 ENCOUNTER — Encounter: Payer: Self-pay | Admitting: Family Medicine

## 2023-10-25 ENCOUNTER — Ambulatory Visit (INDEPENDENT_AMBULATORY_CARE_PROVIDER_SITE_OTHER): Payer: Medicare HMO | Admitting: Family Medicine

## 2023-10-25 VITALS — BP 138/80 | HR 71 | Temp 98.2°F | Ht 65.0 in | Wt 201.0 lb

## 2023-10-25 DIAGNOSIS — Z23 Encounter for immunization: Secondary | ICD-10-CM | POA: Diagnosis not present

## 2023-10-25 DIAGNOSIS — Z944 Liver transplant status: Secondary | ICD-10-CM | POA: Diagnosis not present

## 2023-10-25 DIAGNOSIS — I10 Essential (primary) hypertension: Secondary | ICD-10-CM

## 2023-10-25 DIAGNOSIS — Z0001 Encounter for general adult medical examination with abnormal findings: Secondary | ICD-10-CM | POA: Diagnosis not present

## 2023-10-25 DIAGNOSIS — E785 Hyperlipidemia, unspecified: Secondary | ICD-10-CM | POA: Diagnosis not present

## 2023-10-25 DIAGNOSIS — R739 Hyperglycemia, unspecified: Secondary | ICD-10-CM | POA: Diagnosis not present

## 2023-10-25 DIAGNOSIS — Z125 Encounter for screening for malignant neoplasm of prostate: Secondary | ICD-10-CM

## 2023-10-25 DIAGNOSIS — N183 Chronic kidney disease, stage 3 unspecified: Secondary | ICD-10-CM | POA: Diagnosis not present

## 2023-10-25 LAB — LIPID PANEL
Cholesterol: 145 mg/dL (ref 0–200)
HDL: 38.6 mg/dL — ABNORMAL LOW (ref >39.00)
LDL Cholesterol: 80 mg/dL (ref 0–99)
NonHDL: 106.78
Total CHOL/HDL Ratio: 4
Triglycerides: 135 mg/dL (ref 0.0–149.0)
VLDL: 27 mg/dL (ref 0.0–40.0)

## 2023-10-25 LAB — COMPREHENSIVE METABOLIC PANEL WITH GFR
ALT: 18 U/L (ref 0–53)
AST: 17 U/L (ref 0–37)
Albumin: 4.5 g/dL (ref 3.5–5.2)
Alkaline Phosphatase: 114 U/L (ref 39–117)
BUN: 14 mg/dL (ref 6–23)
CO2: 26 meq/L (ref 19–32)
Calcium: 10 mg/dL (ref 8.4–10.5)
Chloride: 105 meq/L (ref 96–112)
Creatinine, Ser: 1.17 mg/dL (ref 0.40–1.50)
GFR: 63.43 mL/min (ref >60.00)
Glucose, Bld: 98 mg/dL (ref 70–99)
Potassium: 3.7 meq/L (ref 3.5–5.1)
Sodium: 144 meq/L (ref 135–145)
Total Bilirubin: 0.6 mg/dL (ref 0.2–1.2)
Total Protein: 7.2 g/dL (ref 6.0–8.3)

## 2023-10-25 LAB — CBC
HCT: 38.3 % — ABNORMAL LOW (ref 39.0–52.0)
Hemoglobin: 12.4 g/dL — ABNORMAL LOW (ref 13.0–17.0)
MCHC: 32.2 g/dL (ref 30.0–36.0)
MCV: 87.8 fl (ref 78.0–100.0)
Platelets: 139 K/uL — ABNORMAL LOW (ref 150.0–400.0)
RBC: 4.37 Mil/uL (ref 4.22–5.81)
RDW: 14.6 % (ref 11.5–15.5)
WBC: 4.6 K/uL (ref 4.0–10.5)

## 2023-10-25 LAB — HEMOGLOBIN A1C: Hgb A1c MFr Bld: 5.7 % (ref 4.6–6.5)

## 2023-10-25 LAB — PSA: PSA: 1.44 ng/mL (ref 0.10–4.00)

## 2023-10-25 LAB — TSH: TSH: 1.79 u[IU]/mL (ref 0.35–5.50)

## 2023-10-25 NOTE — Patient Instructions (Addendum)
 It was very nice to see you today!  VISIT SUMMARY: Today, you came in for your annual physical exam. Your right shoulder has improved, and you are maintaining a healthy diet and exercise routine. Your blood pressure was slightly elevated, likely due to recent dietary choices. We administered a flu shot, performed blood work, and will recheck your blood pressure before you leave.  YOUR PLAN: ADULT WELLNESS VISIT: Routine visit with no new concerns. Blood pressure is slightly elevated, likely due to dietary indiscretion. -Administered flu shot. -Performed blood work. -Recheck blood pressure before leaving.  ESSENTIAL HYPERTENSION: Blood pressure elevated today, likely from recent high-sodium food intake, but usually well-controlled at home. -Recheck blood pressure before leaving. -Advise home monitoring of blood pressure. -Counsel on reducing dietary sodium intake.  GENERAL HEALTH MAINTENANCE: You are due for a flu shot and a colonoscopy in a couple of years. You maintain a healthy diet and exercise routine. -Administered flu shot. -Encourage continued healthy lifestyle.  FOLLOW-UP: Discussed follow-up plans for routine monitoring and specialist visits. -Schedule follow-up with liver specialist on October 20th. -Follow-up in clinic as needed.  Return in about 1 year (around 10/24/2024) for Annual Physical.   Take care, Dr Kennyth  PLEASE NOTE:  If you had any lab tests, please let us  know if you have not heard back within a few days. You may see your results on mychart before we have a chance to review them but we will give you a call once they are reviewed by us .   If we ordered any referrals today, please let us  know if you have not heard from their office within the next week.   If you had any urgent prescriptions sent in today, please check with the pharmacy within an hour of our visit to make sure the prescription was transmitted appropriately.   Please try these tips to  maintain a healthy lifestyle:  Eat at least 3 REAL meals and 1-2 snacks per day.  Aim for no more than 5 hours between eating.  If you eat breakfast, please do so within one hour of getting up.   Each meal should contain half fruits/vegetables, one quarter protein, and one quarter carbs (no bigger than a computer mouse)  Cut down on sweet beverages. This includes juice, soda, and sweet tea.   Drink at least 1 glass of water with each meal and aim for at least 8 glasses per day  Exercise at least 150 minutes every week.    Preventive Care 4 Years and Older, Male Preventive care refers to lifestyle choices and visits with your health care provider that can promote health and wellness. Preventive care visits are also called wellness exams. What can I expect for my preventive care visit? Counseling During your preventive care visit, your health care provider may ask about your: Medical history, including: Past medical problems. Family medical history. History of falls. Current health, including: Emotional well-being. Home life and relationship well-being. Sexual activity. Memory and ability to understand (cognition). Lifestyle, including: Alcohol, nicotine or tobacco, and drug use. Access to firearms. Diet, exercise, and sleep habits. Work and work Astronomer. Sunscreen use. Safety issues such as seatbelt and bike helmet use. Physical exam Your health care provider will check your: Height and weight. These may be used to calculate your BMI (body mass index). BMI is a measurement that tells if you are at a healthy weight. Waist circumference. This measures the distance around your waistline. This measurement also tells if you are at a  healthy weight and may help predict your risk of certain diseases, such as type 2 diabetes and high blood pressure. Heart rate and blood pressure. Body temperature. Skin for abnormal spots. What immunizations do I need?  Vaccines are usually given  at various ages, according to a schedule. Your health care provider will recommend vaccines for you based on your age, medical history, and lifestyle or other factors, such as travel or where you work. What tests do I need? Screening Your health care provider may recommend screening tests for certain conditions. This may include: Lipid and cholesterol levels. Diabetes screening. This is done by checking your blood sugar (glucose) after you have not eaten for a while (fasting). Hepatitis C test. Hepatitis B test. HIV (human immunodeficiency virus) test. STI (sexually transmitted infection) testing, if you are at risk. Lung cancer screening. Colorectal cancer screening. Prostate cancer screening. Abdominal aortic aneurysm (AAA) screening. You may need this if you are a current or former smoker. Talk with your health care provider about your test results, treatment options, and if necessary, the need for more tests. Follow these instructions at home: Eating and drinking  Eat a diet that includes fresh fruits and vegetables, whole grains, lean protein, and low-fat dairy products. Limit your intake of foods with high amounts of sugar, saturated fats, and salt. Take vitamin and mineral supplements as recommended by your health care provider. Do not drink alcohol if your health care provider tells you not to drink. If you drink alcohol: Limit how much you have to 0-2 drinks a day. Know how much alcohol is in your drink. In the [place] one drink equals one 12 oz bottle of beer (355 mL), one 5 oz glass of wine (148 mL), or one 1 oz glass of hard liquor (44 mL). Lifestyle Brush your teeth every morning and night with fluoride toothpaste. Floss one time each day. Exercise for at least 30 minutes 5 or more days each week. Do not use any products that contain nicotine or tobacco. These products include cigarettes, chewing tobacco, and vaping devices, such as e-cigarettes. If you need help quitting, ask  your health care provider. Do not use drugs. If you are sexually active, practice safe sex. Use a condom or other form of protection to prevent STIs. Take aspirin  only as told by your health care provider. Make sure that you understand how much to take and what form to take. Work with your health care provider to find out whether it is safe and beneficial for you to take aspirin  daily. Ask your health care provider if you need to take a cholesterol-lowering medicine (statin). Find healthy ways to manage stress, such as: Meditation, yoga, or listening to music. Journaling. Talking to a trusted person. Spending time with friends and family. Safety Always wear your seat belt while driving or riding in a vehicle. Do not drive: If you have been drinking alcohol. Do not ride with someone who has been drinking. When you are tired or distracted. While texting. If you have been using any mind-altering substances or drugs. Wear a helmet and other protective equipment during sports activities. If you have firearms in your house, make sure you follow all gun safety procedures. Minimize exposure to UV radiation to reduce your risk of skin cancer. What's next? Visit your health care provider once a year for an annual wellness visit. Ask your health care provider how often you should have your eyes and teeth checked. Stay up to date on all vaccines. This  information is not intended to replace advice given to you by your health care provider. Make sure you discuss any questions you have with your health care provider. Document Revised: 07/03/2020 Document Reviewed: 07/03/2020 Elsevier Patient Education  2024 [employer].

## 2023-10-25 NOTE — Assessment & Plan Note (Signed)
On Lipitor 40 mg daily.  Check lipids. 

## 2023-10-25 NOTE — Assessment & Plan Note (Signed)
 Initially elevated.  At goal on recheck.  Continue current regimen losartan 25 mg daily, amlodipine  10 mg daily, and Cardura  8 mg daily.  Check labs today.

## 2023-10-25 NOTE — Assessment & Plan Note (Signed)
 Continue management per liver clinic at [facility].  Will check LFTs with labs today.

## 2023-10-25 NOTE — Progress Notes (Signed)
 Chief Complaint:  Bruce Conner is a 68 y.o. male who presents today for his annual comprehensive physical exam.    Assessment/Plan:  Chronic Problems Addressed Today: Essential hypertension Initially elevated.  At goal on recheck.  Continue current regimen losartan 25 mg daily, amlodipine  10 mg daily, and Cardura  8 mg daily.  Check labs today.  Status post liver transplant [facility]) Continue management per liver clinic at [facility].  Will check LFTs with labs today.  CKD (chronic kidney disease) stage 3, GFR 30-59 ml/min (HCC) Follows with nephrology.  On losartan 25 mg daily.  Check labs today.  Dyslipidemia On Lipitor 40 mg daily.  Check lipids.  Preventative Healthcare: Flu shot given today.  Check labs.  Up-to-date on colonoscopy.  Patient Counseling(The following topics were reviewed and/or handout was given):  -Nutrition: Stressed importance of moderation in sodium/caffeine intake, saturated fat and cholesterol, caloric balance, sufficient intake of fresh fruits, vegetables, and fiber.  -Stressed the importance of regular exercise.   -Substance Abuse: Discussed cessation/primary prevention of tobacco, alcohol, or other drug use; driving or other dangerous activities under the influence; availability of treatment for abuse.   -Injury prevention: Discussed safety belts, safety helmets, smoke detector, smoking near bedding or upholstery.   -Sexuality: Discussed sexually transmitted diseases, partner selection, use of condoms, avoidance of unintended pregnancy and contraceptive alternatives.   -Dental health: Discussed importance of regular tooth brushing, flossing, and dental visits.  -Health maintenance and immunizations reviewed. Please refer to Health maintenance section.  Return to care in 1 year for next preventative visit.     Subjective:  HPI:  He has no acute complaints today. Patient is here today for his annual physical.  See assessment / plan for status of chronic  conditions.  Discussed the use of AI scribe software for clinical note transcription with the patient, who gave verbal consent to proceed.  History of Present Illness Bruce Conner is a 68 year old male who presents for an annual physical exam.  His right shoulder has improved over the past five to six weeks, attributed to avoiding heavy lifting.  He is currently taking vitamin D with vitamin K , Zyrtec, Prozac, Flonase nasal spray, and vitamin C. He has completed a course of meloxicam.  He experienced elevated blood pressure today, which he attributes to consuming breakfast sausage over the weekend, an indulgence he allows himself about once a month. He monitors his blood pressure at home, where it is typically within normal range.  He is focusing on maintaining a healthy diet rich in fruits and vegetables and is making an effort to walk regularly for exercise.  He is scheduled to see his liver doctor on the twentieth of this month, having not seen him since his last visit here.  He inquired about necessary vaccinations, wanting to ensure he is up to date, especially with the flu season approaching.    Lifestyle Diet: Balanced. Plenty of fruits and vegetables.  Exercise: Doing more walking.      10/25/2023   10:01 AM  Depression screen PHQ 2/9  Decreased Interest 0  Down, Depressed, Hopeless 0  PHQ - 2 Score 0    There are no preventive care reminders to display for this patient.   ROS: Per HPI, otherwise a complete review of systems was negative.   PMH:  The following were reviewed and entered/updated in epic: Past Medical History:  Diagnosis Date   Acute renal injury    Blood transfusion without reported diagnosis  Chronic kidney disease, stage III (moderate) (HCC)    Cirrhosis of liver (HCC)    GERD (gastroesophageal reflux disease)    Hypertension    Hypomagnesemia    Hypophosphatemia    Liver failure (HCC)    Pyorrhea    S/P liver transplant (HCC)  11/12/2014   @ [facility], Cellcept and Prograf    Patient Active Problem List   Diagnosis Date Noted   Stasis dermatitis 04/03/2022   Leg edema 04/03/2022   Dyslipidemia 01/29/2021   Osteopenia 01/27/2021   CKD (chronic kidney disease) stage 3, GFR 30-59 ml/min (HCC) 01/27/2021   Erectile dysfunction 12/06/2018   Status post liver transplant (HCC) 09/30/2015   Essential hypertension 10/29/2014   Thrombocytopenia 10/29/2014   Past Surgical History:  Procedure Laterality Date   ABDOMINAL SURGERY     COLONOSCOPY     ?2009   LIVER TRANSPLANT  11/12/2014   [facility]    Family History  Problem Relation Age of Onset   Hypertension Brother    Colon cancer Neg Hx    Colon polyps Neg Hx    Esophageal cancer Neg Hx    Rectal cancer Neg Hx    Stomach cancer Neg Hx     Medications- reviewed and updated Current Outpatient Medications  Medication Sig Dispense Refill   amLODipine  (NORVASC ) 10 MG tablet Take 1 tablet (10 mg total) by mouth daily. 90 tablet 1   aspirin  EC 81 MG tablet Take 1 tablet (81 mg total) by mouth daily. 90 tablet 3   atorvastatin  (LIPITOR) 40 MG tablet Take 1 tablet (40 mg total) by mouth daily. 90 tablet 3   doxazosin  (CARDURA ) 8 MG tablet TAKE 1 TABLET EVERY DAY 90 tablet 3   K Phos Mono-Sod Phos Di & Mono (VIRT-PHOS 250 NEUTRAL) 155-852-130 MG TABS Take 2 tablets by mouth daily.     losartan (COZAAR) 25 MG tablet Take 25 mg by mouth daily.     magnesium  oxide (MAG-OX) 400 MG tablet Take 400 mg by mouth daily.     mycophenolate  (CELLCEPT) 500 MG tablet      mycophenolate  (MYFORTIC ) 360 MG TBEC EC tablet TAKE 2 TABLET BY MOUTH TWICE DAILY 360 tablet 0   omeprazole (PRILOSEC) 20 MG capsule Take 20 mg by mouth daily.     sulfamethoxazole-trimethoprim (BACTRIM) 400-80 MG tablet Take 1 tablet by mouth 3 (three) times a week.     tacrolimus  (PROGRAF ) 1 MG capsule Take 4 capsules (4 mg total) by mouth 2 (two) times daily. Take 4 capsules in the morning and 4 capsules in the  evening 240 capsule 0   tadalafil  (CIALIS ) 10 MG tablet TAKE 1 TABLET(10 MG) BY MOUTH DAILY AS NEEDED FOR ERECTILE DYSFUNCTION 30 tablet 5   triamcinolone  ointment (KENALOG ) 0.5 % Apply 1 Application topically 2 (two) times daily. 30 g 0   No current facility-administered medications for this visit.    Allergies-reviewed and updated No Known Allergies  Social History   Socioeconomic History   Marital status: Married    Spouse name: Not on file   Number of children: 3   Years of education: 12   Highest education level: Not on file  Occupational History   Occupation: Warehouse  Tobacco Use   Smoking status: Never   Smokeless tobacco: Never  Vaping Use   Vaping status: Never Used  Substance and Sexual Activity   Alcohol use: No   Drug use: No   Sexual activity: Not on file  Other Topics Concern  Not on file  Social History Narrative   Fun: Photographer    Social Drivers of Health   Financial Resource Strain: Low Risk  (06/21/2023)   Overall Financial Resource Strain (CARDIA)    Difficulty of Paying Living Expenses: Not hard at all  Food Insecurity: No Food Insecurity (06/21/2023)   Hunger Vital Sign    Worried About Running Out of Food in the Last Year: Never true    Ran Out of Food in the Last Year: Never true  Transportation Needs: No Transportation Needs (06/21/2023)   PRAPARE - Administrator, Civil Service (Medical): No    Lack of Transportation (Non-Medical): No  Physical Activity: Insufficiently Active (06/21/2023)   Exercise Vital Sign    Days of Exercise per Week: 2 days    Minutes of Exercise per Session: 20 min  Stress: No Stress Concern Present (06/21/2023)   [employer] of Occupational Health - Occupational Stress Questionnaire    Feeling of Stress : Not at all  Social Connections: Socially Isolated (06/21/2023)   Social Connection and Isolation Panel    Frequency of Communication with Friends and Family: Twice a week    Frequency of Social  Gatherings with Friends and Family: More than three times a week    Attends Religious Services: Never    Database administrator or Organizations: No    Attends Engineer, structural: Never    Marital Status: Separated        Objective:  Physical Exam: BP 138/80   Pulse 71   Temp 98.2 F (36.8 C) (Temporal)   Ht 5' 5 (1.651 m)   Wt 201 lb (91.2 kg)   SpO2 95%   BMI 33.45 kg/m   Body mass index is 33.45 kg/m. Wt Readings from Last 3 Encounters:  10/25/23 201 lb (91.2 kg)  09/10/23 201 lb (91.2 kg)  06/21/23 195 lb (88.5 kg)   Gen: NAD, resting comfortably HEENT: TMs normal bilaterally. OP clear. No thyromegaly noted.  CV: RRR with no murmurs appreciated Pulm: NWOB, CTAB with no crackles, wheezes, or rhonchi GI: Normal bowel sounds present. Soft, Nontender, Nondistended. MSK: no edema, cyanosis, or clubbing noted Skin: warm, dry Neuro: CN2-12 grossly intact. Strength 5/5 in upper and lower extremities. Reflexes symmetric and intact bilaterally.  Psych: Normal affect and thought content     Fue Cervenka M. Kennyth, MD 10/25/2023 10:33 AM

## 2023-10-25 NOTE — Assessment & Plan Note (Signed)
 Follows with nephrology.  On losartan 25 mg daily.  Check labs today.

## 2023-10-26 ENCOUNTER — Ambulatory Visit: Payer: Self-pay | Admitting: Family Medicine

## 2023-10-26 NOTE — Progress Notes (Signed)
 Labs are all stable.  Do not need to make any changes to treatment plan.  He should continue to work on diet and exercise and we can recheck again in a year or so.

## 2023-11-17 DIAGNOSIS — D849 Immunodeficiency, unspecified: Secondary | ICD-10-CM | POA: Diagnosis not present

## 2023-11-17 DIAGNOSIS — Z944 Liver transplant status: Secondary | ICD-10-CM | POA: Diagnosis not present

## 2023-11-19 DIAGNOSIS — D849 Immunodeficiency, unspecified: Secondary | ICD-10-CM | POA: Diagnosis not present

## 2023-11-19 DIAGNOSIS — Z944 Liver transplant status: Secondary | ICD-10-CM | POA: Diagnosis not present

## 2023-12-19 DIAGNOSIS — E669 Obesity, unspecified: Secondary | ICD-10-CM | POA: Diagnosis not present

## 2023-12-19 DIAGNOSIS — N182 Chronic kidney disease, stage 2 (mild): Secondary | ICD-10-CM | POA: Diagnosis not present

## 2023-12-19 DIAGNOSIS — E785 Hyperlipidemia, unspecified: Secondary | ICD-10-CM | POA: Diagnosis not present

## 2023-12-19 DIAGNOSIS — Z8249 Family history of ischemic heart disease and other diseases of the circulatory system: Secondary | ICD-10-CM | POA: Diagnosis not present

## 2023-12-19 DIAGNOSIS — Z823 Family history of stroke: Secondary | ICD-10-CM | POA: Diagnosis not present

## 2023-12-19 DIAGNOSIS — Z944 Liver transplant status: Secondary | ICD-10-CM | POA: Diagnosis not present

## 2023-12-19 DIAGNOSIS — K219 Gastro-esophageal reflux disease without esophagitis: Secondary | ICD-10-CM | POA: Diagnosis not present

## 2023-12-19 DIAGNOSIS — Z809 Family history of malignant neoplasm, unspecified: Secondary | ICD-10-CM | POA: Diagnosis not present

## 2023-12-19 DIAGNOSIS — Z7982 Long term (current) use of aspirin: Secondary | ICD-10-CM | POA: Diagnosis not present

## 2023-12-19 DIAGNOSIS — I129 Hypertensive chronic kidney disease with stage 1 through stage 4 chronic kidney disease, or unspecified chronic kidney disease: Secondary | ICD-10-CM | POA: Diagnosis not present

## 2023-12-19 DIAGNOSIS — N529 Male erectile dysfunction, unspecified: Secondary | ICD-10-CM | POA: Diagnosis not present

## 2024-06-21 ENCOUNTER — Ambulatory Visit

## 2024-10-30 ENCOUNTER — Encounter: Admitting: Family Medicine
